# Patient Record
Sex: Female | Born: 1970 | Hispanic: No | Marital: Single | Smoking: Never smoker
Health system: Southern US, Community
[De-identification: ages and names within clinical notes are randomized; demographics above are authoritative.]

## PROBLEM LIST (undated history)

## (undated) DIAGNOSIS — J309 Allergic rhinitis, unspecified: Secondary | ICD-10-CM

## (undated) DIAGNOSIS — L309 Dermatitis, unspecified: Secondary | ICD-10-CM

## (undated) DIAGNOSIS — J45909 Unspecified asthma, uncomplicated: Secondary | ICD-10-CM

## (undated) HISTORY — DX: Allergic rhinitis, unspecified: J30.9

## (undated) HISTORY — DX: Unspecified asthma, uncomplicated: J45.909

## (undated) HISTORY — DX: Dermatitis, unspecified: L30.9

---

## 2001-06-24 ENCOUNTER — Other Ambulatory Visit: Admission: RE | Admit: 2001-06-24 | Discharge: 2001-06-24 | Payer: Self-pay | Admitting: Obstetrics and Gynecology

## 2002-07-01 ENCOUNTER — Other Ambulatory Visit: Admission: RE | Admit: 2002-07-01 | Discharge: 2002-07-01 | Payer: Self-pay | Admitting: Obstetrics and Gynecology

## 2003-07-28 ENCOUNTER — Other Ambulatory Visit: Admission: RE | Admit: 2003-07-28 | Discharge: 2003-07-28 | Payer: Self-pay | Admitting: Obstetrics and Gynecology

## 2003-08-07 ENCOUNTER — Encounter: Admission: RE | Admit: 2003-08-07 | Discharge: 2003-08-07 | Payer: Self-pay | Admitting: Internal Medicine

## 2004-08-16 ENCOUNTER — Other Ambulatory Visit: Admission: RE | Admit: 2004-08-16 | Discharge: 2004-08-16 | Payer: Self-pay | Admitting: Obstetrics and Gynecology

## 2005-09-03 ENCOUNTER — Other Ambulatory Visit: Admission: RE | Admit: 2005-09-03 | Discharge: 2005-09-03 | Payer: Self-pay | Admitting: Obstetrics and Gynecology

## 2006-09-04 ENCOUNTER — Other Ambulatory Visit: Admission: RE | Admit: 2006-09-04 | Discharge: 2006-09-04 | Payer: Self-pay | Admitting: Obstetrics and Gynecology

## 2006-09-29 HISTORY — PX: REFRACTIVE SURGERY: SHX103

## 2007-10-08 ENCOUNTER — Other Ambulatory Visit: Admission: RE | Admit: 2007-10-08 | Discharge: 2007-10-08 | Payer: Self-pay | Admitting: Obstetrics and Gynecology

## 2008-11-17 ENCOUNTER — Other Ambulatory Visit: Admission: RE | Admit: 2008-11-17 | Discharge: 2008-11-17 | Payer: Self-pay | Admitting: Obstetrics and Gynecology

## 2009-02-14 ENCOUNTER — Encounter: Admission: RE | Admit: 2009-02-14 | Discharge: 2009-02-14 | Payer: Self-pay | Admitting: Obstetrics and Gynecology

## 2010-02-15 ENCOUNTER — Encounter: Admission: RE | Admit: 2010-02-15 | Discharge: 2010-02-15 | Payer: Self-pay | Admitting: Obstetrics and Gynecology

## 2011-01-17 ENCOUNTER — Other Ambulatory Visit: Payer: Self-pay | Admitting: Obstetrics and Gynecology

## 2011-01-17 DIAGNOSIS — Z1231 Encounter for screening mammogram for malignant neoplasm of breast: Secondary | ICD-10-CM

## 2011-02-19 ENCOUNTER — Ambulatory Visit
Admission: RE | Admit: 2011-02-19 | Discharge: 2011-02-19 | Disposition: A | Discharge status: Home / Self Care | Payer: 59 | Source: Ambulatory Visit | Attending: Obstetrics and Gynecology | Admitting: Obstetrics and Gynecology

## 2011-02-19 DIAGNOSIS — Z1231 Encounter for screening mammogram for malignant neoplasm of breast: Secondary | ICD-10-CM

## 2012-02-16 ENCOUNTER — Other Ambulatory Visit: Payer: Self-pay | Admitting: Obstetrics and Gynecology

## 2012-02-16 DIAGNOSIS — Z1231 Encounter for screening mammogram for malignant neoplasm of breast: Secondary | ICD-10-CM

## 2012-02-26 ENCOUNTER — Ambulatory Visit
Admission: RE | Admit: 2012-02-26 | Discharge: 2012-02-26 | Disposition: A | Discharge status: Home / Self Care | Payer: 59 | Source: Ambulatory Visit | Attending: Obstetrics and Gynecology | Admitting: Obstetrics and Gynecology

## 2012-02-26 DIAGNOSIS — Z1231 Encounter for screening mammogram for malignant neoplasm of breast: Secondary | ICD-10-CM

## 2012-12-22 ENCOUNTER — Telehealth: Payer: Self-pay | Admitting: *Deleted

## 2012-12-22 MED ORDER — LEVONORGESTREL-ETHINYL ESTRAD 0.1-20 MG-MCG PO TABS: 1.0000 | ORAL_TABLET | Freq: Every day | ORAL | Status: DC

## 2012-12-22 NOTE — Telephone Encounter (Signed)
Was sent via fax on 12/21/12

## 2013-02-02 ENCOUNTER — Other Ambulatory Visit: Payer: Self-pay

## 2013-02-02 DIAGNOSIS — Z1231 Encounter for screening mammogram for malignant neoplasm of breast: Secondary | ICD-10-CM

## 2013-02-04 ENCOUNTER — Ambulatory Visit: Payer: Self-pay | Admitting: Obstetrics and Gynecology

## 2013-02-16 ENCOUNTER — Encounter: Payer: Self-pay | Admitting: Obstetrics and Gynecology

## 2013-02-17 ENCOUNTER — Encounter: Payer: Self-pay | Admitting: Obstetrics and Gynecology

## 2013-02-18 ENCOUNTER — Ambulatory Visit (INDEPENDENT_AMBULATORY_CARE_PROVIDER_SITE_OTHER): Payer: 59 | Admitting: Obstetrics and Gynecology

## 2013-02-18 ENCOUNTER — Encounter: Payer: Self-pay | Admitting: Obstetrics and Gynecology

## 2013-02-18 VITALS — BP 102/70 | Ht 61.25 in | Wt 147.0 lb

## 2013-02-18 DIAGNOSIS — Z01419 Encounter for gynecological examination (general) (routine) without abnormal findings: Secondary | ICD-10-CM

## 2013-02-18 DIAGNOSIS — Z0000 Encounter for general adult medical examination without abnormal findings: Secondary | ICD-10-CM

## 2013-02-18 MED ORDER — LEVONORGESTREL-ETHINYL ESTRAD 0.1-20 MG-MCG PO TABS: 1.0000 | ORAL_TABLET | Freq: Every day | ORAL | Status: DC

## 2013-02-18 NOTE — Patient Instructions (Addendum)

## 2013-02-18 NOTE — Progress Notes (Signed)
42 y.o.   Single    African American   female   G0P0000   here for annual exam. Wants to quit work or at least cut back.  Menses regular on OC's and wants to continue.    Patient's last menstrual period was 01/21/2013.          Sexually active: no  The current method of family planning is OCP (estrogen/progesterone).    Exercising: jogging, swimming, cycling, weight training Last mammogram:  02/26/12 neg Last pap smear: 12/21/09 neg History of abnormal pap: no Smoking:never Alcohol: 1 glass of wine a week Last colonoscopy:never Last Bone Density:  never Last tetanus shot: less than 10 years Last cholesterol check: 12/23/12  normal  Hgb:@ cone                Urine: @ cone   Family History  Problem Relation Age of Onset  . Diabetes Mother   . Hypertension Father   . Hyperlipidemia Father   . Sarcoidosis Sister     There are no active problems to display for this patient.   Past Medical History  Diagnosis Date  . Asthma   . Eczema     Past Surgical History  Procedure Laterality Date  . Refractive surgery  2008    Allergies: Lanolin and Neomycin  Current Outpatient Prescriptions  Medication Sig Dispense Refill  . ALBUTEROL IN Inhale into the lungs as needed.      Marland Kitchen azelastine (ASTELIN) 137 MCG/SPRAY nasal spray Place 1 spray into the nose 2 (two) times daily. Use in each nostril as directed      . Bepotastine Besilate (BEPREVE) 1.5 % SOLN as needed.      . cetirizine (ZYRTEC) 10 MG tablet Take 10 mg by mouth daily.      . Cholecalciferol (VITAMIN D PO) Take 600 mg by mouth daily.       . hydrOXYzine (ATARAX/VISTARIL) 10 MG tablet Take 10 mg by mouth 3 (three) times daily as needed for itching.      Marland Kitchen levonorgestrel-ethinyl estradiol (FALMINA) 0.1-20 MG-MCG tablet Take 1 tablet by mouth daily.  1 Package  2  . mometasone (ELOCON) 0.1 % cream Apply topically as needed.      . Multiple Vitamin (MULTIVITAMIN) tablet Take 1 tablet by mouth daily.       No current  facility-administered medications for this visit.    ROS: Pertinent items are noted in HPI.  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[place], no children, MD allergist  Exam:    BP 102/70  Ht 5' 1.25" (1.556 m)  Wt 147 lb (66.679 kg)  BMI 27.54 kg/m2  LMP 01/21/2013   Wt Readings from Last 3 Encounters:  02/18/13 147 lb (66.679 kg)     Ht Readings from Last 3 Encounters:  02/18/13 5' 1.25" (1.556 m)    General appearance: alert, cooperative and appears stated age Head: Normocephalic, without obvious abnormality, atraumatic Neck: no adenopathy, supple, symmetrical, trachea midline and thyroid not enlarged, symmetric, no tenderness/mass/nodules Lungs: clear to auscultation bilaterally Breasts: Inspection negative, No nipple retraction or dimpling, No nipple discharge or bleeding, No axillary or supraclavicular adenopathy, Normal to palpation without dominant masses Heart: regular rate and rhythm Abdomen: soft, non-tender; bowel sounds normal; no masses,  no organomegaly Extremities: extremities normal, atraumatic, no cyanosis or edema Skin: Skin color, texture, turgor normal. No rashes or lesions Lymph nodes: Cervical, supraclavicular, and axillary nodes normal. No abnormal inguinal nodes palpated Neurologic: Grossly normal  Pelvic: External genitalia:  no lesions              Urethra:  normal appearing urethra with no masses, tenderness or lesions              Bartholins and Skenes: normal                 Vagina: normal appearing vagina with normal color and discharge, no lesions              Cervix: normal appearance              Pap taken: yes        Bimanual Exam:  Uterus:  uterus is normal size, shape, consistency and nontender, AF, mobile                                      Adnexa: normal adnexa in size, nontender and no masses                                      Rectovaginal: Confirms                                      Anus:  normal sphincter tone, no  lesions  A: normal gyn exam, OC's     P: mammogram pap smear counseled on breast self exam, mammography screening, adequate intake of calcium and vitamin D, diet and exercise return annually or prn     An After Visit Summary was printed and given to the patient.

## 2013-02-19 LAB — VITAMIN D 25 HYDROXY (VIT D DEFICIENCY, FRACTURES): Vit D, 25-Hydroxy: 34 ng/mL (ref 30–89)

## 2013-02-23 LAB — IPS PAP TEST WITH HPV

## 2013-03-07 ENCOUNTER — Ambulatory Visit
Admission: RE | Admit: 2013-03-07 | Discharge: 2013-03-07 | Disposition: A | Discharge status: Home / Self Care | Payer: 59 | Source: Ambulatory Visit

## 2013-03-07 DIAGNOSIS — Z1231 Encounter for screening mammogram for malignant neoplasm of breast: Secondary | ICD-10-CM

## 2013-03-16 ENCOUNTER — Telehealth: Payer: Self-pay | Admitting: Obstetrics and Gynecology

## 2013-03-16 NOTE — Telephone Encounter (Signed)
Patient just wanted to let you know that her mammogram was normal. She just wanted to touch base with you and find out if she to be seen of can she can have her year f/u

## 2013-03-17 NOTE — Telephone Encounter (Signed)
LMTCB  aa 

## 2013-03-17 NOTE — Telephone Encounter (Signed)
Spoke with pt about MMG. Pt states the results showed something different than usual, "fibroglandular tissue" and pt is wondering if she can discuss whether or not to wait a whole year for her next MMG or exam, or if she needs to do something sooner. After learning of CR's retirement in August, pt wants to see her again to get her opinion. Scheduled consult visit 05-13-13 at 8:30 per pt request.

## 2013-05-09 ENCOUNTER — Ambulatory Visit: Payer: Self-pay | Admitting: Obstetrics and Gynecology

## 2013-05-13 ENCOUNTER — Ambulatory Visit (INDEPENDENT_AMBULATORY_CARE_PROVIDER_SITE_OTHER): Payer: 59 | Admitting: Obstetrics and Gynecology

## 2013-05-13 ENCOUNTER — Encounter: Payer: Self-pay | Admitting: Obstetrics and Gynecology

## 2013-05-13 VITALS — BP 120/70 | Wt 148.0 lb

## 2013-05-13 DIAGNOSIS — N644 Mastodynia: Secondary | ICD-10-CM

## 2013-05-13 NOTE — Patient Instructions (Signed)
Return for routine care

## 2013-05-13 NOTE — Progress Notes (Signed)
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  Pt c/o left lateral mastodynia for about a year.  She has never felt a mass, and I felt no mass at her AnEx in May 2014.  She was concerned about the language in the letter she received about her mammo results saying she has dense breasts.  She has gained 20 pounds recently, and does zumba class among other exercises.  We discussed breast density and reviewed her mammo report together.  We discussed how suspensory ligaments can be strained especially if her breasts have increased fat from her weight gain.  Rec:  Excellent support of her breasts with sports bras, maybe even at night and especially at zumba class.  Of course, weight loss would be helpful, too.    Questions answered.

## 2013-08-11 ENCOUNTER — Telehealth: Payer: Self-pay | Admitting: Obstetrics and Gynecology

## 2013-08-11 NOTE — Telephone Encounter (Signed)
AEX was 02/18/13 #3 packs with 3 refills was sent to Pharmacy, called pharmacy patient picked up 3 months this early November patient lost those rx's according to Pharmacist. She didn't have any refills left because the pharmacy had a old rx that was sent in 3/14.  Called in refills until 5/14. Patient is aware that she will have to pay out of pocket since the insurance had already covered her for the next 3 months, patient said she already discussed that with the pharmacy.

## 2013-08-11 NOTE — Telephone Encounter (Signed)
Patient calling needing refills for generic Lutera  in 90 day increments please. Patient thought we had sent this at last AEX but pharmacy does not have it on file.  [facility]

## 2014-03-06 ENCOUNTER — Ambulatory Visit: Payer: 59 | Admitting: Obstetrics & Gynecology

## 2014-03-20 ENCOUNTER — Encounter: Payer: Self-pay | Admitting: Obstetrics & Gynecology

## 2014-03-20 ENCOUNTER — Ambulatory Visit (INDEPENDENT_AMBULATORY_CARE_PROVIDER_SITE_OTHER): Payer: 59 | Admitting: Obstetrics & Gynecology

## 2014-03-20 ENCOUNTER — Other Ambulatory Visit: Payer: Self-pay

## 2014-03-20 VITALS — BP 128/83 | HR 67 | Temp 98.0°F | Ht 62.0 in | Wt 155.0 lb

## 2014-03-20 DIAGNOSIS — Z1231 Encounter for screening mammogram for malignant neoplasm of breast: Secondary | ICD-10-CM

## 2014-03-20 DIAGNOSIS — Z01419 Encounter for gynecological examination (general) (routine) without abnormal findings: Secondary | ICD-10-CM

## 2014-03-20 MED ORDER — LEVONORGESTREL-ETHINYL ESTRAD 0.1-20 MG-MCG PO TABS: 1.0000 | ORAL_TABLET | Freq: Every day | ORAL | Status: DC

## 2014-03-20 MED ORDER — CIPROFLOXACIN HCL 250 MG PO TABS
250.0000 mg | ORAL_TABLET | Freq: Two times a day (BID) | ORAL | Status: DC
Start: 2014-03-20 — End: 2016-05-07

## 2014-03-20 NOTE — Progress Notes (Signed)
Subjective:     Frances Stewart is a 43 y.o. female here for a routine exam.  Current complaints: none.    Personal health questionnaire:  Is patient [name], have a family history of breast and/or ovarian cancer: no Is there a family history of uterine cancer diagnosed at age < 21, gastrointestinal cancer, urinary tract cancer, family member who is a Field seismologist syndrome-associated carrier: no Is the patient overweight and hypertensive, family history of diabetes, personal history of gestational diabetes or PCOS: no Is patient over 51, have PCOS,  family history of premature CHD under age 37, diabetes, smoke, have hypertension or peripheral artery disease:  no   Gynecologic History Patient's last menstrual period was 03/12/2014. Contraception: OCP (estrogen/progesterone) Lutera Last Pap: normal. Results were: normal Last mammogram: 1 yr ago. Results were: normal  Obstetric History OB History  Gravida Para Term Preterm AB SAB TAB Ectopic Multiple Living  0 0 0 0 0 0 0 0 0 0         Past Medical History  Diagnosis Date  . Asthma   . Eczema     Past Surgical History  Procedure Laterality Date  . Refractive surgery  2008    Current outpatient prescriptions:ALBUTEROL IN, Inhale into the lungs as needed., Disp: , Rfl: ;  azelastine (ASTELIN) 137 MCG/SPRAY nasal spray, Place 1 spray into the nose 2 (two) times daily. Use in each nostril as directed, Disp: , Rfl: ;  Bepotastine Besilate (BEPREVE) 1.5 % SOLN, as needed., Disp: , Rfl: ;  cetirizine (ZYRTEC) 10 MG tablet, Take 10 mg by mouth daily., Disp: , Rfl:  Cholecalciferol (VITAMIN D PO), Take 600 mg by mouth daily. , Disp: , Rfl: ;  hydrOXYzine (ATARAX/VISTARIL) 10 MG tablet, Take 10 mg by mouth 3 (three) times daily as needed for itching., Disp: , Rfl: ;  levonorgestrel-ethinyl estradiol (AVIANE,ALESSE,LESSINA) 0.1-20 MG-MCG tablet, Take 1 tablet by mouth daily., Disp: , Rfl: ;  mometasone (ELOCON) 0.1 % cream, Apply topically as  needed., Disp: , Rfl:  Multiple Vitamin (MULTIVITAMIN) tablet, Take 1 tablet by mouth daily., Disp: , Rfl:  Allergies  Allergen Reactions  . Lanolin Rash  . Neomycin Rash    History  Substance Use Topics  . Smoking status: Never Smoker   . Smokeless tobacco: Never Used  . Alcohol Use: 0.5 oz/week    1 drink(s) per week     Comment: 1 glass of wine a week    Family History  Problem Relation Age of Onset  . Diabetes Mother   . Hypertension Father   . Hyperlipidemia Father   . Heart disease Father   . Sarcoidosis Sister       Review of Systems  Constitutional: negative for fatigue and weight loss Respiratory: negative for cough and wheezing Cardiovascular: negative for chest pain, fatigue and palpitations Gastrointestinal: negative for abdominal pain and change in bowel habits Musculoskeletal:negative for myalgias Neurological: negative for gait problems and tremors Behavioral/Psych: negative for abusive relationship, depression Endocrine: negative for temperature intolerance   Genitourinary:negative for abnormal menstrual periods, genital lesions, hot flashes, sexual problems and vaginal discharge Integument/breast: negative for breast lump, breast tenderness, nipple discharge and skin lesion(s)    Objective:       BP 128/83  Pulse 67  Temp(Src) 98 F (36.7 C)  Ht 5\' 2"  (1.575 m)  Wt 70.308 kg (155 lb)  BMI 28.34 kg/m2  LMP 03/12/2014 General:   alert  Skin:   no rash or abnormalities  Lungs:  clear to auscultation bilaterally  Heart:   regular rate and rhythm, S1, S2 normal, no murmur, click, rub or gallop  Breasts:   normal without suspicious masses, skin or nipple changes or axillary nodes  Abdomen:  normal findings: no organomegaly, soft, non-tender and no hernia  Pelvis:  External genitalia: normal general appearance Urinary system: urethral meatus normal and bladder without fullness, nontender Vaginal: normal without tenderness, induration or  masses Cervix: normal appearance Adnexa: normal bimanual exam Uterus: anteverted and non-tender, normal size   Lab Review  Labs reviewed no Radiologic studies reviewed no    Assessment:    Healthy female exam.    Plan:    Education reviewed: calcium supplements, low fat, low cholesterol diet and weight bearing exercise. Contraception: OCP (estrogen/progesterone).   Meds ordered this encounter  Medications  . levonorgestrel-ethinyl estradiol (AVIANE,ALESSE,LESSINA) 0.1-20 MG-MCG tablet    Sig: Take 1 tablet by mouth daily.    Follow up as needed.

## 2014-03-20 NOTE — Patient Instructions (Signed)

## 2014-03-21 LAB — PAP IG (IMAGE GUIDED)

## 2014-03-24 ENCOUNTER — Ambulatory Visit: Payer: 59 | Admitting: Obstetrics and Gynecology

## 2014-03-27 ENCOUNTER — Ambulatory Visit
Admission: RE | Admit: 2014-03-27 | Discharge: 2014-03-27 | Disposition: A | Discharge status: Home / Self Care | Payer: 59 | Source: Ambulatory Visit

## 2014-03-27 DIAGNOSIS — Z1231 Encounter for screening mammogram for malignant neoplasm of breast: Secondary | ICD-10-CM

## 2014-09-25 ENCOUNTER — Encounter: Payer: Self-pay | Admitting: *Deleted

## 2014-09-26 ENCOUNTER — Encounter: Payer: Self-pay | Admitting: Obstetrics & Gynecology

## 2014-11-27 ENCOUNTER — Other Ambulatory Visit: Payer: Self-pay | Admitting: *Deleted

## 2014-11-27 DIAGNOSIS — Z3041 Encounter for surveillance of contraceptive pills: Secondary | ICD-10-CM

## 2014-11-27 MED ORDER — LEVONORGESTREL-ETHINYL ESTRAD 0.1-20 MG-MCG PO TABS: 1.0000 | ORAL_TABLET | Freq: Every day | ORAL | Status: DC

## 2015-03-26 ENCOUNTER — Ambulatory Visit: Payer: 59 | Admitting: Obstetrics & Gynecology

## 2015-05-14 ENCOUNTER — Other Ambulatory Visit: Payer: Self-pay

## 2015-05-14 DIAGNOSIS — Z1231 Encounter for screening mammogram for malignant neoplasm of breast: Secondary | ICD-10-CM

## 2015-05-18 ENCOUNTER — Ambulatory Visit (INDEPENDENT_AMBULATORY_CARE_PROVIDER_SITE_OTHER): Payer: 59 | Admitting: Certified Nurse Midwife

## 2015-05-18 ENCOUNTER — Encounter: Payer: Self-pay | Admitting: Certified Nurse Midwife

## 2015-05-18 VITALS — BP 131/86 | HR 73 | Temp 97.6°F | Ht 62.0 in | Wt 158.2 lb

## 2015-05-18 DIAGNOSIS — Z01419 Encounter for gynecological examination (general) (routine) without abnormal findings: Secondary | ICD-10-CM

## 2015-05-18 DIAGNOSIS — Z3041 Encounter for surveillance of contraceptive pills: Secondary | ICD-10-CM | POA: Diagnosis not present

## 2015-05-18 MED ORDER — LEVONORGESTREL-ETHINYL ESTRAD 0.1-20 MG-MCG PO TABS: 1.0000 | ORAL_TABLET | Freq: Every day | ORAL | Status: DC

## 2015-05-18 NOTE — Progress Notes (Signed)
Patient ID: [facility], female   DOB: 1971/05/19, 44 y.o.   MRN: [id number]    Subjective:        Frances [facility] is a 44 y.o. female here for a routine exam.  Current complaints: none.  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  Declines STD screening examination.  Denies any premenopausal symptoms.    Does desire to loose about 25 lbs.    Personal health questionnaire:  Is patient Frances Stewart, have a family history of breast and/or ovarian cancer: no Is there a family history of uterine cancer diagnosed at age < 31, gastrointestinal cancer, urinary tract cancer, family member who is a Field seismologist syndrome-associated carrier: no Is the patient overweight and hypertensive, family history of diabetes, personal history of gestational diabetes, preeclampsia or PCOS: no Is patient over 19, have PCOS,  family history of premature CHD under age 77, diabetes, smoke, have hypertension or peripheral artery disease:  yes At any time, has a partner hit, kicked or otherwise hurt or frightened you?: no Over the past 2 weeks, have you felt down, depressed or hopeless?: no Over the past 2 weeks, have you felt little interest or pleasure in doing things?:no   Gynecologic History Patient's last menstrual period was 05/06/2015. Contraception: OCP (estrogen/progesterone) Last Pap: 03/20/14. Results were: normal Last mammogram: 03/28/14. Results were: normal  Obstetric History OB History  Gravida Para Term Preterm AB SAB TAB Ectopic Multiple Living  0 0 0 0 0 0 0 0 0 0         Past Medical History  Diagnosis Date  . Asthma   . Eczema     Past Surgical History  Procedure Laterality Date  . Refractive surgery  2008     Current outpatient prescriptions:  .  ALBUTEROL IN, Inhale into the lungs as needed., Disp: , Rfl:  .  azelastine (ASTELIN) 137 MCG/SPRAY nasal spray, Place 1 spray into the nose 2 (two) times daily. Use in each nostril as directed, Disp: , Rfl:  .  Bepotastine Besilate (BEPREVE) 1.5  % SOLN, as needed., Disp: , Rfl:  .  cetirizine (ZYRTEC) 10 MG tablet, Take 10 mg by mouth daily., Disp: , Rfl:  .  Cholecalciferol (VITAMIN D PO), Take 600 mg by mouth daily. , Disp: , Rfl:  .  hydrOXYzine (ATARAX/VISTARIL) 10 MG tablet, Take 10 mg by mouth 3 (three) times daily as needed for itching., Disp: , Rfl:  .  levonorgestrel-ethinyl estradiol (AVIANE,ALESSE,LESSINA) 0.1-20 MG-MCG tablet, Take 1 tablet by mouth daily. Patient is using continuous pills for 3 months and then to have cycle., Disp: 3 Package, Rfl: 4 .  magnesium oxide (MAG-OX) 400 MG tablet, Take 400 mg by mouth daily., Disp: , Rfl:  .  mometasone (ELOCON) 0.1 % cream, Apply topically as needed., Disp: , Rfl:  .  Multiple Vitamin (MULTIVITAMIN) tablet, Take 1 tablet by mouth daily., Disp: , Rfl:  .  ciprofloxacin (CIPRO) 250 MG tablet, Take 1 tablet (250 mg total) by mouth 2 (two) times daily. For 5 days (Patient not taking: Reported on 05/18/2015), Disp: 10 tablet, Rfl: 0 Allergies  Allergen Reactions  . Lanolin Rash  . Neomycin Rash    Social History  Substance Use Topics  . Smoking status: Never Smoker   . Smokeless tobacco: Never Used  . Alcohol Use: 0.5 oz/week    1 drink(s) per week     Comment: 1 glass of wine a week    Family History  Problem Relation Age of  Onset  . Diabetes Mother   . Hypertension Father   . Hyperlipidemia Father   . Heart disease Father   . Sarcoidosis Sister       Review of Systems  Constitutional: negative for fatigue and weight loss Respiratory: negative for cough and wheezing Cardiovascular: negative for chest pain, fatigue and palpitations Gastrointestinal: negative for abdominal pain and change in bowel habits Musculoskeletal:negative for myalgias Neurological: negative for gait problems and tremors Behavioral/Psych: negative for abusive relationship, depression Endocrine: negative for temperature intolerance   Genitourinary:negative for abnormal menstrual periods,  genital lesions, hot flashes, sexual problems and vaginal discharge Integument/breast: negative for breast lump, breast tenderness, nipple discharge and skin lesion(s)    Objective:       BP 131/86 mmHg  Pulse 73  Temp(Src) 97.6 F (36.4 C)  Ht 5\' 2"  (1.575 m)  Wt 158 lb 3.2 oz (71.759 kg)  BMI 28.93 kg/m2  LMP 05/06/2015 General:   alert  Skin:   no rash or abnormalities  Lungs:   clear to auscultation bilaterally  Heart:   regular rate and rhythm, S1, S2 normal, no murmur, click, rub or gallop  Breasts:   normal without suspicious masses, skin or nipple changes or axillary nodes  Abdomen:  normal findings: no organomegaly, soft, non-tender and no hernia  Pelvis:  External genitalia: normal general appearance Urinary system: urethral meatus normal and bladder without fullness, nontender Vaginal: normal without tenderness, induration or masses Cervix: normal appearance Adnexa: normal bimanual exam Uterus: anteverted and non-tender, normal size   Lab Review Urine pregnancy test Labs reviewed yes Radiologic studies reviewed yes  50% of 30 min visit spent on counseling and coordination of care.   Assessment:    Healthy female exam.   Contraception Management.  Plan:    Education reviewed: calcium supplements, depression evaluation, low fat, low cholesterol diet, safe sex/STD prevention, self breast exams, skin cancer screening and weight bearing exercise. Contraception: OCP (estrogen/progesterone). Follow up in: 1 year. Mammogram scheduled in September.    Meds ordered this encounter  Medications  . magnesium oxide (MAG-OX) 400 MG tablet    Sig: Take 400 mg by mouth daily.  Marland Kitchen levonorgestrel-ethinyl estradiol (AVIANE,ALESSE,LESSINA) 0.1-20 MG-MCG tablet    Sig: Take 1 tablet by mouth daily. Patient is using continuous pills for 3 months and then to have cycle.    Dispense:  3 Package    Refill:  4   Orders Placed This Encounter  Procedures  . SureSwab,  Vaginosis/Vaginitis Plus

## 2015-05-23 LAB — SURESWAB, VAGINOSIS/VAGINITIS PLUS
Atopobium vaginae: NOT DETECTED Log (cells/mL)
C. albicans, DNA: NOT DETECTED
C. glabrata, DNA: NOT DETECTED
C. parapsilosis, DNA: NOT DETECTED
C. trachomatis RNA, TMA: NOT DETECTED
C. tropicalis, DNA: NOT DETECTED
Gardnerella vaginalis: NOT DETECTED Log (cells/mL)
LACTOBACILLUS SPECIES: NOT DETECTED Log (cells/mL)
MEGASPHAERA SPECIES: NOT DETECTED Log (cells/mL)
N. gonorrhoeae RNA, TMA: NOT DETECTED
T. vaginalis RNA, QL TMA: NOT DETECTED

## 2015-05-23 LAB — PAP, TP IMAGING W/ HPV RNA, RFLX HPV TYPE 16,18/45: HPV mRNA, High Risk: NOT DETECTED

## 2015-06-18 ENCOUNTER — Ambulatory Visit
Admission: RE | Admit: 2015-06-18 | Discharge: 2015-06-18 | Disposition: A | Discharge status: Home / Self Care | Payer: 59 | Source: Ambulatory Visit

## 2015-06-18 ENCOUNTER — Ambulatory Visit: Payer: 59

## 2015-06-18 DIAGNOSIS — Z1231 Encounter for screening mammogram for malignant neoplasm of breast: Secondary | ICD-10-CM

## 2015-10-22 DIAGNOSIS — R51 Headache: Secondary | ICD-10-CM | POA: Diagnosis not present

## 2015-10-22 DIAGNOSIS — M542 Cervicalgia: Secondary | ICD-10-CM | POA: Diagnosis not present

## 2015-11-26 MED FILL — LEVONOR-ETH ESTRAD 0.1-0.02: 0.1-20 | 84 days supply | Qty: 112 | Fill #3

## 2015-12-10 DIAGNOSIS — L821 Other seborrheic keratosis: Secondary | ICD-10-CM | POA: Diagnosis not present

## 2015-12-10 DIAGNOSIS — D171 Benign lipomatous neoplasm of skin and subcutaneous tissue of trunk: Secondary | ICD-10-CM | POA: Diagnosis not present

## 2015-12-10 DIAGNOSIS — L300 Nummular dermatitis: Secondary | ICD-10-CM | POA: Diagnosis not present

## 2015-12-10 MED FILL — hydrOXYzine HCL 10 MG TABS: 10 | 30 days supply | Qty: 60 | Fill #0

## 2015-12-10 MED FILL — HYDROCORTISONE 2.5% CREAM: 2.5 | 30 days supply | Qty: 30 | Fill #0

## 2015-12-10 MED FILL — TRIAMCINOLONE 0.1% CREAM: 0.1 | 30 days supply | Qty: 80 | Fill #0

## 2015-12-21 MED FILL — MAGNESIUM OXIDE 400 MG TAB: 400 | 90 days supply | Qty: 90 | Fill #0

## 2016-01-15 MED FILL — CROMOLYN 4% EYE DROPS: 4 | 50 days supply | Qty: 10 | Fill #1

## 2016-01-15 MED FILL — HYDROCORTISONE 2.5% CREAM: 2.5 | 30 days supply | Qty: 30 | Fill #1

## 2016-01-15 MED FILL — TRIAMCINOLONE 0.1% CREAM: 0.1 | 30 days supply | Qty: 80 | Fill #1

## 2016-02-06 DIAGNOSIS — Z23 Encounter for immunization: Secondary | ICD-10-CM | POA: Diagnosis not present

## 2016-02-06 DIAGNOSIS — Z0000 Encounter for general adult medical examination without abnormal findings: Secondary | ICD-10-CM | POA: Diagnosis not present

## 2016-02-14 DIAGNOSIS — M722 Plantar fascial fibromatosis: Secondary | ICD-10-CM | POA: Diagnosis not present

## 2016-02-14 DIAGNOSIS — M7042 Prepatellar bursitis, left knee: Secondary | ICD-10-CM | POA: Diagnosis not present

## 2016-02-14 DIAGNOSIS — M9905 Segmental and somatic dysfunction of pelvic region: Secondary | ICD-10-CM | POA: Diagnosis not present

## 2016-02-14 DIAGNOSIS — M791 Myalgia: Secondary | ICD-10-CM | POA: Diagnosis not present

## 2016-02-14 DIAGNOSIS — M7661 Achilles tendinitis, right leg: Secondary | ICD-10-CM | POA: Diagnosis not present

## 2016-02-14 DIAGNOSIS — M9906 Segmental and somatic dysfunction of lower extremity: Secondary | ICD-10-CM | POA: Diagnosis not present

## 2016-02-14 DIAGNOSIS — M9903 Segmental and somatic dysfunction of lumbar region: Secondary | ICD-10-CM | POA: Diagnosis not present

## 2016-02-14 DIAGNOSIS — M7071 Other bursitis of hip, right hip: Secondary | ICD-10-CM | POA: Diagnosis not present

## 2016-02-29 MED FILL — LEVONOR-ETH ESTRAD 0.1-0.02: 0.1-20 | 84 days supply | Qty: 112 | Fill #0

## 2016-02-29 MED FILL — AZELASTINE HCL 137 MCG SPRY: 0.1 | 30 days supply | Qty: 30 | Fill #0

## 2016-03-13 MED FILL — MAGNESIUM OXIDE 400 MG TAB: 400 | 90 days supply | Qty: 90 | Fill #1

## 2016-04-21 ENCOUNTER — Other Ambulatory Visit: Payer: Self-pay | Admitting: *Deleted

## 2016-04-21 ENCOUNTER — Telehealth: Payer: Self-pay | Admitting: *Deleted

## 2016-04-21 DIAGNOSIS — Z3041 Encounter for surveillance of contraceptive pills: Secondary | ICD-10-CM

## 2016-04-21 MED ORDER — LEVONORGESTREL-ETHINYL ESTRAD 0.1-20 MG-MCG PO TABS: 1.0000 | ORAL_TABLET | Freq: Every day | ORAL | 4 refills | Status: DC

## 2016-04-21 NOTE — Telephone Encounter (Signed)
Patient is going to have a change in her insurance before her annual exam. She is asking for a refill of her OCP so she can still get a 3 month supply. Patient moved her appointment up and she is checking her insurance to make sure she can be seen early.

## 2016-04-22 ENCOUNTER — Other Ambulatory Visit: Payer: Self-pay | Admitting: Certified Nurse Midwife

## 2016-04-22 DIAGNOSIS — Z3041 Encounter for surveillance of contraceptive pills: Secondary | ICD-10-CM

## 2016-04-22 MED ORDER — LEVONORGESTREL-ETHINYL ESTRAD 0.1-20 MG-MCG PO TABS: 1.0000 | ORAL_TABLET | Freq: Every day | ORAL | 4 refills | Status: DC

## 2016-04-22 NOTE — Telephone Encounter (Signed)
Please let her know I have sent in the refills for her.  Thank you.  R.Anyssa Sharpless CNM

## 2016-04-28 ENCOUNTER — Other Ambulatory Visit: Payer: Self-pay | Admitting: Certified Nurse Midwife

## 2016-04-28 ENCOUNTER — Other Ambulatory Visit: Payer: Self-pay | Admitting: Obstetrics & Gynecology

## 2016-04-28 DIAGNOSIS — Z1231 Encounter for screening mammogram for malignant neoplasm of breast: Secondary | ICD-10-CM

## 2016-05-01 ENCOUNTER — Ambulatory Visit: Payer: 59 | Admitting: Certified Nurse Midwife

## 2016-05-07 ENCOUNTER — Ambulatory Visit (INDEPENDENT_AMBULATORY_CARE_PROVIDER_SITE_OTHER): Payer: 59 | Admitting: Obstetrics & Gynecology

## 2016-05-07 ENCOUNTER — Encounter: Payer: Self-pay | Admitting: Obstetrics & Gynecology

## 2016-05-07 VITALS — BP 141/94 | HR 67 | Temp 98.2°F | Ht 62.0 in | Wt 172.3 lb

## 2016-05-07 DIAGNOSIS — N852 Hypertrophy of uterus: Secondary | ICD-10-CM

## 2016-05-07 DIAGNOSIS — Z01419 Encounter for gynecological examination (general) (routine) without abnormal findings: Secondary | ICD-10-CM | POA: Diagnosis not present

## 2016-05-07 NOTE — Patient Instructions (Signed)
Uterine Fibroids Uterine fibroids are tissue masses (tumors) that can develop in the womb (uterus). They are also called leiomyomas. This type of tumor is not cancerous (benign) and does not spread to other parts of the body outside of the pelvic area, which is between the hip bones. Occasionally, fibroids may develop in the fallopian tubes, in the cervix, or on the support structures (ligaments) that surround the uterus. You can have one or many fibroids. Fibroids can vary in size, weight, and where they grow in the uterus. Some can become quite large. Most fibroids do not require medical treatment. CAUSES A fibroid can develop when a single uterine cell keeps growing (replicating). Most cells in the human body have a control mechanism that keeps them from replicating without control. SIGNS AND SYMPTOMS Symptoms may include:   Heavy bleeding during your period.  Bleeding or spotting between periods.  Pelvic pain and pressure.  Bladder problems, such as needing to urinate more often (urinary frequency) or urgently.  Inability to reproduce offspring (infertility).  Miscarriages. DIAGNOSIS Uterine fibroids are diagnosed through a physical exam. Your health care provider may feel the lumpy tumors during a pelvic exam. Ultrasonography and an MRI may be done to determine the size, location, and number of fibroids. TREATMENT Treatment may include:  Watchful waiting. This involves getting the fibroid checked by your health care provider to see if it grows or shrinks. Follow your health care provider's recommendations for how often to have this checked.  Hormone medicines. These can be taken by mouth or given through an intrauterine device (IUD).  Surgery.  Removing the fibroids (myomectomy) or the uterus (hysterectomy).  Removing blood supply to the fibroids (uterine artery embolization). If fibroids interfere with your fertility and you want to become pregnant, your health care provider  may recommend having the fibroids removed.  HOME CARE INSTRUCTIONS  Keep all follow-up visits as directed by your health care provider. This is important.  Take medicines only as directed by your health care provider.  If you were prescribed a hormone treatment, take the hormone medicines exactly as directed.  Do not take aspirin, because it can cause bleeding.  Ask your health care provider about taking iron pills and increasing the amount of dark green, leafy vegetables in your diet. These actions can help to boost your blood iron levels, which may be affected by heavy menstrual bleeding.  Pay close attention to your period and tell your health care provider about any changes, such as:  Increased blood flow that requires you to use more pads or tampons than usual per month.  A change in the number of days that your period lasts per month.  A change in symptoms that are associated with your period, such as abdominal cramping or back pain. SEEK MEDICAL CARE IF:  You have pelvic pain, back pain, or abdominal cramps that cannot be controlled with medicines.  You have an increase in bleeding between and during periods.  You soak tampons or pads in a half hour or less.  You feel lightheaded, extra tired, or weak. SEEK IMMEDIATE MEDICAL CARE IF:  You faint.  You have a sudden increase in pelvic pain.   This information is not intended to replace advice given to you by your health care provider. Make sure you discuss any questions you have with your health care provider.   Document Released: 09/12/2000 Document Revised: 10/06/2014 Document Reviewed: 03/14/2014 Elsevier Interactive Patient Education 2016 [employer]  

## 2016-05-07 NOTE — Progress Notes (Signed)
Subjective:     [name] is a 45 y.o. female here for a routine exam.  Current complaints: none. Pt with occ HA takes Excedrin prn and Magnesium for that.  She denies aura with her HA.    Gynecologic History Patient's last menstrual period was 04/25/2016 (exact date). Contraception: OCP (estrogen/progesterone) Last Pap: 04/2015. Results were: normal Last mammogram: 05/2015. Results were: normal  Obstetric History OB History  Gravida Para Term Preterm AB Living  0 0 0 0 0 0  SAB TAB Ectopic Multiple Live Births  0 0 0 0          The following portions of the patient's history were reviewed and updated as appropriate: allergies, current medications, past family history, past medical history, past social history, past surgical history and problem list.   Review of Systems Pertinent items are noted in HPI.    Objective:   BP (!) 141/94   Pulse 67   Temp 98.2 F (36.8 C) (Oral)   Ht 5\' 2"  (1.575 m)   Wt 172 lb 4.8 oz (78.2 kg)   LMP 04/25/2016 (Exact Date)   BMI 31.51 kg/m  General Appearance:    Alert, cooperative, no distress, appears stated age  Head:    Normocephalic, without obvious abnormality, atraumatic  Eyes:    conjunctiva/corneas clear, EOM's intact, both eyes  Ears:    Normal external ear canals, both ears  Nose:   Nares normal, septum midline, mucosa normal, no drainage    or sinus tenderness  Throat:   Lips, mucosa, and tongue normal; teeth and gums normal  Neck:   Supple, symmetrical, trachea midline, no adenopathy;    thyroid:  no enlargement/tenderness/nodules  Back:     Symmetric, no curvature, ROM normal, no CVA tenderness  Lungs:     Clear to auscultation bilaterally, respirations unlabored  Chest Wall:    No tenderness or deformity   Heart:    Regular rate and rhythm, S1 and S2 normal, no murmur, rub   or gallop  Breast Exam:    No tenderness, masses, or nipple abnormality  Abdomen:     Soft, non-tender, bowel sounds active all four quadrants,   no masses, no organomegaly  Genitalia:    Normal female without lesion, discharge or tenderness; enlarged uterus ~10-12 weeks sized with irreg contour.       Extremities:   Extremities normal, atraumatic, no cyanosis or edema  Pulses:   2+ and symmetric all extremities  Skin:   Skin color, texture, turgor normal, no rashes or lesions     Assessment:    Healthy female exam.   Enlarged uterus- suspect uterine fibroids Contraception counseling- reviewed LARC. Pt wants to continue OCP's for now.   Elevated BP      Plan:  F/u in 1 year or sooner prn Cont OCPs Pelvic sono to eval uterine enlargement Mammogram in sept- pt will make appt. Pt will walk in for nurse visit to recheck BP  Chardonay Scritchfield L. Harraway-Smith, M.D., Cherlynn June

## 2016-05-08 DIAGNOSIS — Z124 Encounter for screening for malignant neoplasm of cervix: Secondary | ICD-10-CM | POA: Diagnosis not present

## 2016-05-12 LAB — IGP, APTIMA HPV, RFX 16/18,45
HPV Aptima: NEGATIVE
PAP Smear Comment: 0

## 2016-05-15 ENCOUNTER — Ambulatory Visit (HOSPITAL_COMMUNITY): Payer: 59

## 2016-05-15 MED FILL — MAGNESIUM OXIDE 400 MG TAB: 400 | 90 days supply | Qty: 90 | Fill #2

## 2016-05-15 MED FILL — LEVONOR-ETH ESTRAD 0.1-0.02: 0.1-20 | 84 days supply | Qty: 112 | Fill #1

## 2016-05-15 MED FILL — TRIAMCINOLONE 0.1% CREAM: 0.1 | 30 days supply | Qty: 80 | Fill #2

## 2016-05-20 ENCOUNTER — Ambulatory Visit: Payer: 59 | Admitting: Certified Nurse Midwife

## 2016-05-21 ENCOUNTER — Ambulatory Visit: Payer: 59 | Admitting: Certified Nurse Midwife

## 2016-06-19 ENCOUNTER — Ambulatory Visit: Payer: 59

## 2016-06-20 ENCOUNTER — Ambulatory Visit: Payer: 59

## 2016-06-24 ENCOUNTER — Ambulatory Visit
Admission: RE | Admit: 2016-06-24 | Discharge: 2016-06-24 | Disposition: A | Discharge status: Home / Self Care | Payer: 59 | Source: Ambulatory Visit | Attending: Certified Nurse Midwife | Admitting: Certified Nurse Midwife

## 2016-06-24 DIAGNOSIS — Z1231 Encounter for screening mammogram for malignant neoplasm of breast: Secondary | ICD-10-CM | POA: Diagnosis not present

## 2016-07-29 ENCOUNTER — Ambulatory Visit (INDEPENDENT_AMBULATORY_CARE_PROVIDER_SITE_OTHER): Payer: 59 | Admitting: Podiatry

## 2016-07-29 ENCOUNTER — Encounter: Payer: Self-pay | Admitting: Podiatry

## 2016-07-29 ENCOUNTER — Ambulatory Visit (HOSPITAL_BASED_OUTPATIENT_CLINIC_OR_DEPARTMENT_OTHER)
Admission: RE | Admit: 2016-07-29 | Discharge: 2016-07-29 | Disposition: A | Discharge status: Home / Self Care | Payer: 59 | Source: Ambulatory Visit | Attending: Podiatry | Admitting: Podiatry

## 2016-07-29 DIAGNOSIS — R52 Pain, unspecified: Secondary | ICD-10-CM | POA: Diagnosis not present

## 2016-07-29 DIAGNOSIS — M722 Plantar fascial fibromatosis: Secondary | ICD-10-CM

## 2016-07-29 DIAGNOSIS — M79673 Pain in unspecified foot: Secondary | ICD-10-CM | POA: Diagnosis not present

## 2016-07-29 DIAGNOSIS — M79671 Pain in right foot: Secondary | ICD-10-CM | POA: Diagnosis not present

## 2016-07-29 NOTE — Patient Instructions (Signed)

## 2016-07-29 NOTE — Progress Notes (Signed)
   Subjective:    Patient ID: Frances [facility], female    DOB: 1971/07/24, 45 y.o.   MRN: [id number]  HPI    Review of Systems     Objective:   Physical Exam        Assessment & Plan:

## 2016-07-29 NOTE — Progress Notes (Signed)
   Subjective:    Patient ID: Frances Stewart, female    DOB: 01/29/1971, 45 y.o.   MRN: [id number]  HPI  45 year old female presents the office today for concerns of right foot pain within the arch of the foot. This for about 7 months. She gets pain to the arch and the heel. She does have pain when she first gets up. She was able to run a 5K over the weekend without any pain and she uses intermittent discomfort. No recent injury or trauma. Denies any swelling or redness. No numbness or tingling. Pain does not wake her up at night. No other complaints at this time.  Review of Systems  All other systems reviewed and are negative.      Objective:   Physical Exam General: AAO x3, NAD  Dermatological: Skin is warm, dry and supple bilateral. Nails x 10 are well manicured; remaining integument appears unremarkable at this time. There are no open sores, no preulcerative lesions, no rash or signs of infection present.  Vascular: Dorsalis Pedis artery and Posterior Tibial artery pedal pulses are 2/4 bilateral with immedate capillary fill time.  There is no pain with calf compression, swelling, warmth, erythema.   Neruologic: Grossly intact via light touch bilateral. Vibratory intact via tuning fork bilateral. Protective threshold with Semmes Wienstein monofilament intact to all pedal sites bilateral.   Musculoskeletal: There is mild decrease in medial arch height upon weightbearing. Subjective there is tenderness on medial band plantar fasciitis the foot however she denies any pain today. There is no area pinpoint bony tenderness there is no pain the vibratory sensation. Range of motion intact. Muscular strength 5/5 in all groups tested bilateral. Equinus is present.  Gait: Unassisted, Nonantalgic.      Assessment & Plan:  45 year old female with right heel pain/arch pain likely plantar fasciitis -Treatment options discussed including all alternatives, risks, and complications -Etiology of  symptoms were discussed -X-rays ordered and reviewed -Night splint -Discussed orthotics. She was scanned for orthotics and they were sent to [facility] labs. -Follow-up in 3-4 or sooner if needed. Call any questions or concerns in the meantime.  Celesta Gentile, DPM

## 2016-08-04 ENCOUNTER — Telehealth: Payer: Self-pay | Admitting: *Deleted

## 2016-08-04 NOTE — Telephone Encounter (Signed)
Pt asked if she could wear the ProWedge more than just resting, more like to bed.  I told pt yes, but not to walk in it is was not made for that, could break or be slick. Pt states understanding.

## 2016-08-19 ENCOUNTER — Encounter: Payer: Self-pay | Admitting: Podiatry

## 2016-08-19 ENCOUNTER — Ambulatory Visit (INDEPENDENT_AMBULATORY_CARE_PROVIDER_SITE_OTHER): Payer: 59 | Admitting: Podiatry

## 2016-08-19 DIAGNOSIS — M722 Plantar fascial fibromatosis: Secondary | ICD-10-CM

## 2016-08-19 NOTE — Progress Notes (Signed)
Patient presents to [facility]. She states she was doing good and declined an appointment with myself. She was seen by Cranford Mon, CMA. Oral and Written break-in instructions discussed. Follow-up in 4 weeks.

## 2016-08-19 NOTE — Patient Instructions (Signed)

## 2016-08-29 DIAGNOSIS — K5900 Constipation, unspecified: Secondary | ICD-10-CM | POA: Diagnosis not present

## 2016-09-02 MED FILL — LEVONOR-ETH ESTRAD 0.1-0.02: 0.1-20 | 84 days supply | Qty: 112 | Fill #0

## 2016-09-02 MED FILL — TRIAMCINOLONE 0.1% CREAM: 0.1 | 30 days supply | Qty: 80 | Fill #3

## 2016-09-02 MED FILL — HYDROCORTISONE 2.5% CREAM: 2.5 | 30 days supply | Qty: 30 | Fill #2

## 2016-09-04 DIAGNOSIS — D509 Iron deficiency anemia, unspecified: Secondary | ICD-10-CM | POA: Diagnosis not present

## 2016-09-04 DIAGNOSIS — R194 Change in bowel habit: Secondary | ICD-10-CM | POA: Diagnosis not present

## 2016-09-04 DIAGNOSIS — Z1211 Encounter for screening for malignant neoplasm of colon: Secondary | ICD-10-CM | POA: Diagnosis not present

## 2016-09-04 DIAGNOSIS — K5904 Chronic idiopathic constipation: Secondary | ICD-10-CM | POA: Diagnosis not present

## 2016-09-04 MED FILL — GAVILYTE-G SOLUTION: 236 | 30 days supply | Qty: 4000 | Fill #0

## 2016-09-04 MED FILL — MAGNESIUM OXIDE 400 MG TAB: 400 | 90 days supply | Qty: 90 | Fill #0

## 2016-09-15 DIAGNOSIS — D125 Benign neoplasm of sigmoid colon: Secondary | ICD-10-CM | POA: Diagnosis not present

## 2016-09-15 DIAGNOSIS — K635 Polyp of colon: Secondary | ICD-10-CM | POA: Diagnosis not present

## 2016-09-15 DIAGNOSIS — Z1211 Encounter for screening for malignant neoplasm of colon: Secondary | ICD-10-CM | POA: Diagnosis not present

## 2016-09-16 MED FILL — CROMOLYN 4% EYE DROPS: 4 | 50 days supply | Qty: 10 | Fill #0

## 2016-09-24 MED FILL — SM EYE ITCH RELIEF 0.025% D: 0.025 | 20 days supply | Qty: 15 | Fill #0

## 2016-11-11 MED FILL — SM EYE ITCH RELIEF 0.025% D: 0.025 | 20 days supply | Qty: 15 | Fill #1

## 2016-11-11 MED FILL — MAGNESIUM OXIDE 400 MG TAB: 400 | 90 days supply | Qty: 90 | Fill #1

## 2016-11-12 MED FILL — TRIAMCINOLONE 0.1% CREAM: 0.1 | 30 days supply | Qty: 80 | Fill #0

## 2016-12-18 MED FILL — LEVONOR-ETH ESTRAD 0.1-0.02: 0.1-20 | 21 days supply | Qty: 28 | Fill #1

## 2017-01-20 MED FILL — LEVONOR-ETH ESTRAD 0.1-0.02: 0.1-20 | 84 days supply | Qty: 112 | Fill #2

## 2017-01-28 MED FILL — PROAIR RESPICLICK INHAL PWD: 108 (90 BAS | 17 days supply | Qty: 1 | Fill #0

## 2017-01-30 MED FILL — HYDROCORTISONE 2.5% CREAM: 2.5 | 10 days supply | Qty: 30 | Fill #0

## 2017-05-04 MED FILL — SM EYE ITCH RELIEF 0.025% D: 0.025 | 20 days supply | Qty: 15 | Fill #2

## 2017-05-04 MED FILL — HYDROCORTISONE 2.5% CREAM: 2.5 | 10 days supply | Qty: 30 | Fill #1

## 2017-05-05 DIAGNOSIS — J309 Allergic rhinitis, unspecified: Secondary | ICD-10-CM | POA: Diagnosis not present

## 2017-05-05 DIAGNOSIS — R030 Elevated blood-pressure reading, without diagnosis of hypertension: Secondary | ICD-10-CM | POA: Diagnosis not present

## 2017-05-05 DIAGNOSIS — G5711 Meralgia paresthetica, right lower limb: Secondary | ICD-10-CM | POA: Diagnosis not present

## 2017-05-05 DIAGNOSIS — L309 Dermatitis, unspecified: Secondary | ICD-10-CM | POA: Diagnosis not present

## 2017-05-05 MED FILL — MAGNESIUM OXIDE 400 MG TAB: 400 | 90 days supply | Qty: 90 | Fill #0

## 2017-05-05 MED FILL — TRIAMCINOLONE 0.1% CREAM: 0.1 | 30 days supply | Qty: 80 | Fill #0

## 2017-05-06 DIAGNOSIS — H538 Other visual disturbances: Secondary | ICD-10-CM | POA: Diagnosis not present

## 2017-05-06 DIAGNOSIS — H11133 Conjunctival pigmentations, bilateral: Secondary | ICD-10-CM | POA: Diagnosis not present

## 2017-05-06 DIAGNOSIS — H1013 Acute atopic conjunctivitis, bilateral: Secondary | ICD-10-CM | POA: Diagnosis not present

## 2017-05-15 ENCOUNTER — Other Ambulatory Visit: Payer: Self-pay | Admitting: Obstetrics & Gynecology

## 2017-05-15 DIAGNOSIS — Z1231 Encounter for screening mammogram for malignant neoplasm of breast: Secondary | ICD-10-CM

## 2017-05-19 ENCOUNTER — Ambulatory Visit: Payer: 59 | Admitting: Obstetrics & Gynecology

## 2017-05-21 MED FILL — TRIAMCINOLONE 0.1% CREAM: 0.1 | 20 days supply | Qty: 80 | Fill #1

## 2017-05-21 MED FILL — CROMOLYN 4% EYE DROPS: 4 | 50 days supply | Qty: 10 | Fill #0

## 2017-05-27 ENCOUNTER — Encounter: Payer: Self-pay | Admitting: Obstetrics and Gynecology

## 2017-05-27 ENCOUNTER — Ambulatory Visit (INDEPENDENT_AMBULATORY_CARE_PROVIDER_SITE_OTHER): Payer: 59 | Admitting: Obstetrics and Gynecology

## 2017-05-27 VITALS — BP 130/88 | HR 75 | Ht 62.0 in | Wt 167.2 lb

## 2017-05-27 DIAGNOSIS — Z1151 Encounter for screening for human papillomavirus (HPV): Secondary | ICD-10-CM | POA: Diagnosis not present

## 2017-05-27 DIAGNOSIS — Z124 Encounter for screening for malignant neoplasm of cervix: Secondary | ICD-10-CM

## 2017-05-27 DIAGNOSIS — Z01419 Encounter for gynecological examination (general) (routine) without abnormal findings: Secondary | ICD-10-CM | POA: Diagnosis not present

## 2017-05-27 DIAGNOSIS — Z3041 Encounter for surveillance of contraceptive pills: Secondary | ICD-10-CM

## 2017-05-27 MED ORDER — LEVONORGESTREL-ETHINYL ESTRAD 0.1-20 MG-MCG PO TABS: 1.0000 | ORAL_TABLET | Freq: Every day | ORAL | 4 refills | Status: DC

## 2017-05-27 MED FILL — LEVONOR-ETH ESTRAD 0.1-0.02: 0.1-20 | 84 days supply | Qty: 112 | Fill #0

## 2017-05-27 NOTE — Progress Notes (Signed)
Subjective:     Frances Stewart is a 46 y.o. female G60 with BMI 30 who is here for a comprehensive physical exam. The patient reports no problems. She is sexually active using OCP for contraception. She denies any abnormal vaginal bleeding, discharge or pelvic pain. She denies any leakage of fluid. She denies any vasomotor symptoms  Past Medical History:  Diagnosis Date  . Allergic rhinitis   . Asthma   . Eczema    Past Surgical History:  Procedure Laterality Date  . REFRACTIVE SURGERY  2008   Family History  Problem Relation Age of Onset  . Hypertension Mother   . Hypertension Father   . Hyperlipidemia Father   . Heart disease Father   . Sarcoidosis Sister   . Hypertension Sister      Social History   Social History  . Marital status: Single    Spouse name: N/A  . Number of children: N/A  . Years of education: N/A   Occupational History  . Not on file.   Social History Main Topics  . Smoking status: Never Smoker  . Smokeless tobacco: Never Used  . Alcohol use 0.5 oz/week    1 Standard drinks or equivalent per week     Comment: 1 glass of wine a week  . Drug use: No  . Sexual activity: Not Currently    Partners: Male    Birth control/ protection: Pill     Comment: Lutera   Other Topics Concern  . Not on file   Social History Narrative  . No narrative on file   Health Maintenance  Topic Date Due  . HIV Screening  01/14/1984  . INFLUENZA VACCINE  04/29/2017  . TETANUS/TDAP  09/29/2017  . PAP SMEAR  05/09/2019       Review of Systems Pertinent items are noted in HPI.   Objective:  Blood pressure 130/88, pulse 75, height 5\' 2"  (1.575 m), weight 167 lb 3.2 oz (75.8 kg), last menstrual period 05/06/2017.     GENERAL: Well-developed, well-nourished female in no acute distress.  HEENT: Normocephalic, atraumatic. Sclerae anicteric.  NECK: Supple. Normal thyroid.  LUNGS: Clear to auscultation bilaterally.  HEART: Regular rate and rhythm. BREASTS:  Symmetric in size. No palpable masses or lymphadenopathy, skin changes, or nipple drainage. ABDOMEN: Soft, nontender, nondistended. No organomegaly. PELVIC: Normal external female genitalia. Vagina is pink and rugated.  Normal discharge. Normal appearing cervix. Uterus is12-weeks in size. No adnexal mass or tenderness. EXTREMITIES: No cyanosis, clubbing, or edema, 2+ distal pulses.    Assessment:    Healthy female exam.      Plan:    Pap smear collected Patient scheduled for mammogram in September Patient reports normal routine health labs in March with PCP Patient did not have pelvic ultrasound due to cost. Patient is currently asymptomatic from her fibroid uterus Patient will be contacted with abnormal results She declined STD screen See After Visit Summary for Counseling Recommendations

## 2017-05-27 NOTE — Addendum Note (Signed)
Addended by: Maryruth Eve on: 05/27/2017 03:43 PM   Modules accepted: Orders

## 2017-05-28 ENCOUNTER — Other Ambulatory Visit: Payer: Self-pay | Admitting: Obstetrics and Gynecology

## 2017-05-28 LAB — VITAMIN D 25 HYDROXY (VIT D DEFICIENCY, FRACTURES): Vit D, 25-Hydroxy: 19.6 ng/mL — ABNORMAL LOW (ref 30.0–100.0)

## 2017-05-28 LAB — HEMOGLOBIN A1C
Est. average glucose Bld gHb Est-mCnc: 105 mg/dL
Hgb A1c MFr Bld: 5.3 % (ref 4.8–5.6)

## 2017-05-28 MED ORDER — VITAMIN D (ERGOCALCIFEROL) 1.25 MG (50000 UNIT) PO CAPS: 50000.0000 [IU] | ORAL_CAPSULE | ORAL | 1 refills | Status: DC

## 2017-05-29 ENCOUNTER — Telehealth: Payer: Self-pay

## 2017-05-29 LAB — CYTOLOGY - PAP: Diagnosis: NEGATIVE

## 2017-05-29 NOTE — Telephone Encounter (Signed)
Contacted pt and advised of results and rx sent by provider. 

## 2017-07-27 MED FILL — CROMOLYN 4% EYE DROPS: 4 | 50 days supply | Qty: 10 | Fill #1

## 2017-07-27 MED FILL — PROAIR RESPICLICK INHAL PWD: 108 (90 BAS | 17 days supply | Qty: 1 | Fill #1

## 2017-07-27 MED FILL — MAGNESIUM OXIDE 400 MG TABS: 400 | 90 days supply | Qty: 90 | Fill #1

## 2017-07-27 MED FILL — HYDROCORTISONE 2.5% CREAM: 2.5 | 30 days supply | Qty: 60 | Fill #0

## 2017-07-27 MED FILL — TRIAMCINOLONE 0.1% CREAM: 0.1 | 20 days supply | Qty: 80 | Fill #2

## 2017-07-29 MED FILL — VIT D2 1.25 MG (50,000 UNIT: 1.25 MG | 84 days supply | Qty: 12 | Fill #0

## 2017-07-31 ENCOUNTER — Ambulatory Visit
Admission: RE | Admit: 2017-07-31 | Discharge: 2017-07-31 | Disposition: A | Discharge status: Home / Self Care | Payer: 59 | Source: Ambulatory Visit | Attending: Obstetrics & Gynecology | Admitting: Obstetrics & Gynecology

## 2017-07-31 DIAGNOSIS — Z1231 Encounter for screening mammogram for malignant neoplasm of breast: Secondary | ICD-10-CM | POA: Diagnosis not present

## 2017-07-31 MED FILL — LEVONOR-ETH ESTRAD 0.1-0.02: 0.1-20 | 84 days supply | Qty: 112 | Fill #1

## 2017-07-31 MED FILL — SM EYE ITCH RELIEF 0.025% D: 0.025 | 90 days supply | Qty: 15 | Fill #0

## 2017-09-18 MED FILL — AZELASTINE HCL 137 MCG SPRY: 0.1 | 75 days supply | Qty: 90 | Fill #0

## 2017-09-18 MED FILL — HYDROCORTISONE 2.5% CREAM: 2.5 | 30 days supply | Qty: 60 | Fill #1

## 2017-09-21 MED FILL — TRIAMCINOLONE 0.1% CREAM: 0.1 | 20 days supply | Qty: 80 | Fill #0

## 2017-12-09 MED FILL — MAGNESIUM OXIDE 400 MG TABS: 400 | 90 days supply | Qty: 90 | Fill #2 | Status: TO

## 2017-12-09 MED FILL — HYDROCORTISONE 2.5% CREAM: 2.5 | 30 days supply | Qty: 60 | Fill #2

## 2017-12-09 MED FILL — CROMOLYN 4% EYE DROPS: 4 | 50 days supply | Qty: 10 | Fill #2 | Status: TO

## 2017-12-09 MED FILL — LEVONOR-ETH ESTRAD 0.1-0.02: 0.1-20 | 84 days supply | Qty: 112 | Fill #2 | Status: TO

## 2017-12-11 MED FILL — AZELASTINE HCL 137 MCG SPRY: 0.1 | 75 days supply | Qty: 90 | Fill #1 | Status: TO

## 2017-12-25 ENCOUNTER — Telehealth: Payer: Self-pay | Admitting: *Deleted

## 2017-12-25 NOTE — Telephone Encounter (Signed)
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

## 2017-12-28 NOTE — Telephone Encounter (Signed)
Left message requesting clarification of pt's request, orthotic reorder or script.

## 2017-12-28 NOTE — Telephone Encounter (Signed)
OK to do a prescription or have her come in to see [facility] for inserts.

## 2018-01-04 ENCOUNTER — Telehealth: Payer: Self-pay | Admitting: *Deleted

## 2018-01-04 NOTE — Telephone Encounter (Signed)
Dr. Orinda Kenner asked the cost of the orthotics and to be sent to her.

## 2018-01-05 NOTE — Telephone Encounter (Signed)
Called pt and she is asking for a rx for orthotics and she will take care of it in [place].. Pt is also considering getting her old ones refurbished. If I get the script I can mail it to pt.

## 2018-01-06 ENCOUNTER — Other Ambulatory Visit: Payer: Self-pay | Admitting: Podiatry

## 2018-01-06 DIAGNOSIS — M722 Plantar fascial fibromatosis: Secondary | ICD-10-CM

## 2018-09-23 ENCOUNTER — Ambulatory Visit (INDEPENDENT_AMBULATORY_CARE_PROVIDER_SITE_OTHER): Payer: Self-pay | Admitting: Obstetrics and Gynecology

## 2018-09-23 ENCOUNTER — Encounter: Payer: Self-pay | Admitting: Obstetrics and Gynecology

## 2018-09-23 VITALS — BP 142/99 | HR 77 | Resp 16 | Ht 62.0 in | Wt 171.6 lb

## 2018-09-23 DIAGNOSIS — Z01419 Encounter for gynecological examination (general) (routine) without abnormal findings: Secondary | ICD-10-CM

## 2018-09-23 DIAGNOSIS — Z3009 Encounter for other general counseling and advice on contraception: Secondary | ICD-10-CM

## 2018-09-23 DIAGNOSIS — Z3041 Encounter for surveillance of contraceptive pills: Secondary | ICD-10-CM

## 2018-09-23 MED ORDER — LEVONORGESTREL-ETHINYL ESTRAD 0.1-20 MG-MCG PO TABS: 1.0000 | ORAL_TABLET | Freq: Every day | ORAL | 4 refills | Status: DC

## 2018-09-23 MED ORDER — BLOOD PRESSURE MONITOR/L CUFF MISC: 1.0000 | Freq: Every day | 0 refills | Status: AC

## 2018-09-23 MED FILL — LEVONOR-ETH ESTRAD 0.1-0.02: 0.1-20 | 84 days supply | Qty: 112 | Fill #0

## 2018-09-23 NOTE — Patient Instructions (Signed)

## 2018-09-23 NOTE — Progress Notes (Signed)
GYNECOLOGY ANNUAL PREVENTATIVE CARE ENCOUNTER NOTE  Subjective:   Frances Stewart is a 47 y.o. G0P0000 female here for a annual gynecologic exam. Current complaints: no issues.  Denies abnormal vaginal bleeding, discharge, pelvic pain, problems with intercourse or other gynecologic concerns. Due for pap 04/2020.  She has regular bleeding with her OCPs, she is happy with OCPs. Takes them continuously 3 months at a time.  Gynecologic History Patient's last menstrual period was 08/31/2018 (approximate). Contraception: OCP (estrogen/progesterone) Last Pap: 04/2017. Results were: normal Last mammogram: 2018. Results were: Birads 1  Obstetric History OB History  Gravida Para Term Preterm AB Living  0 0 0 0 0 0  SAB TAB Ectopic Multiple Live Births  0 0 0 0     Past Medical History:  Diagnosis Date  . Allergic rhinitis   . Asthma   . Eczema    Past Surgical History:  Procedure Laterality Date  . REFRACTIVE SURGERY  2008   Current Outpatient Medications on File Prior to Visit  Medication Sig Dispense Refill  . ALBUTEROL IN Inhale into the lungs as needed.    Marland Kitchen azelastine (ASTELIN) 137 MCG/SPRAY nasal spray Place 1 spray into the nose 2 (two) times daily. Use in each nostril as directed    . cetirizine (ZYRTEC) 10 MG tablet Take 10 mg by mouth daily.    . cholecalciferol (VITAMIN D) 1000 units tablet Take 1,000 Units by mouth 2 (two) times daily.    . cromolyn (OPTICROM) 4 % ophthalmic solution INSTILL 1 DROP IN BOTH EYES TWICE A DAY  3  . hydrocortisone 2.5 % cream APPLY ON THE FACE AS DIRECTED  3  . hydrOXYzine (ATARAX/VISTARIL) 10 MG tablet Take 10 mg by mouth 3 (three) times daily as needed for itching.    . magnesium oxide (MAG-OX) 400 MG tablet Take 400 mg by mouth daily.    . Multiple Vitamin (MULTIVITAMIN) tablet Take 1 tablet by mouth daily.    . SM EYE ITCH RELIEF 0.025 % ophthalmic solution PLACE 1 DROP IN EACH EYE TWICE A DAY  3  . triamcinolone cream (KENALOG) 0.1 %  APPLY A THIN LAYER TO THE AFFECTED AREA TWICE DAILY  2   No current facility-administered medications on file prior to visit.    Allergies  Allergen Reactions  . Lanolin Rash  . Neomycin Rash   Social History   Socioeconomic History  . Marital status: Single    Spouse name: Not on file  . Number of children: Not on file  . Years of education: Not on file  . Highest education level: Not on file  Occupational History  . Not on file  Social Needs  . Financial resource strain: Not on file  . Food insecurity:    Worry: Not on file    Inability: Not on file  . Transportation needs:    Medical: Not on file    Non-medical: Not on file  Tobacco Use  . Smoking status: Never Smoker  . Smokeless tobacco: Never Used  Substance and Sexual Activity  . Alcohol use: Yes    Alcohol/week: 1.0 standard drinks    Types: 1 Standard drinks or equivalent per week    Comment: 1 glass of wine a week  . Drug use: No  . Sexual activity: Not Currently    Partners: Male    Birth control/protection: Pill    Comment: Lutera  Lifestyle  . Physical activity:    Days per week: Not on file  Minutes per session: Not on file  . Stress: Not on file  Relationships  . Social connections:    Talks on phone: Not on file    Gets together: Not on file    Attends religious service: Not on file    Active member of club or organization: Not on file    Attends meetings of clubs or organizations: Not on file    Relationship status: Not on file  . Intimate partner violence:    Fear of current or ex partner: Not on file    Emotionally abused: Not on file    Physically abused: Not on file    Forced sexual activity: Not on file  Other Topics Concern  . Not on file  Social History Narrative  . Not on file   Family History  Problem Relation Age of Onset  . Hypertension Mother   . Hypertension Father   . Hyperlipidemia Father   . Heart disease Father   . Sarcoidosis Sister   . Hypertension Sister   .  Breast cancer Neg Hx    Diet:  Exercise: working with a trainer  The following portions of the patient's history were reviewed and updated as appropriate: allergies, current medications, past family history, past medical history, past social history, past surgical history and problem list.  Review of Systems Pertinent items are noted in HPI.   Objective:  BP (!) 142/99 (BP Location: Right Arm, Patient Position: Sitting, Cuff Size: Normal)   Pulse 77   Resp 16   Ht 5\' 2"  (1.575 m)   Wt 171 lb 9.6 oz (77.8 kg)   LMP 08/31/2018 (Approximate)   BMI 31.39 kg/m  CONSTITUTIONAL: Well-developed, well-nourished female in no acute distress.  HENT:  Normocephalic, atraumatic, External right and left ear normal. Oropharynx is clear and moist EYES: Conjunctivae and EOM are normal. Pupils are equal, round, and reactive to light. No scleral icterus.  NECK: Normal range of motion, supple, no masses.  Normal thyroid.  SKIN: Skin is warm and dry. No rash noted. Not diaphoretic. No erythema. No pallor. NEUROLOGIC: Alert and oriented to person, place, and time. Normal reflexes, muscle tone coordination. No cranial nerve deficit noted. PSYCHIATRIC: Normal mood and affect. Normal behavior. Normal judgment and thought content. CARDIOVASCULAR: Normal heart rate noted, regular rhythm RESPIRATORY: Clear to auscultation bilaterally. Effort and breath sounds normal, no problems with respiration noted. BREASTS: Symmetric in size. No masses, skin changes, nipple drainage, or lymphadenopathy. ABDOMEN: Soft, normal bowel sounds, no distention noted.  No tenderness, rebound or guarding.  PELVIC: Normal appearing external genitalia; normal appearing vaginal mucosa and cervix.  No abnormal discharge noted. Normal uterine size, fibroid palpated on right, no other palpable masses, no uterine or adnexal tenderness. MUSCULOSKELETAL: Normal range of motion. No tenderness.  No cyanosis, clubbing, or edema.  2+ distal  pulses.  PAP 04/2017 Adequacy Reason Satisfactory for evaluation, endocervical/transformation zone component PRESENT. Diagnosis NEGATIVE FOR INTRAEPITHELIAL LESIONS OR MALIGNANCY. TANYA SPEED Cytotechnologist Electronic Signature (Case signed 05/29/2017)  Assessment and Plan:   1. Well woman exam Declined STI screen - MM 3D SCREEN BREAST BILATERAL; Future - benign exam  2. Encounter for other general counseling or advice on contraception - On OCPs, has been very happy with them, has regulated bleeding. Would like to continue as long as it is safe to do so. Reviewed risks of OCPs, particularly elevated risk of stroke and clot and that if she has HTN, would take her off OCPs as combined risk considered too  high.  - With elevated BP today, will send home with BP cuff. If BP remains elevated at home, would consider chronic HTN and recommend she discontinue OCPs.  - Reviewed LARCS, she will consider IUD.  - Reviewed this plan with patient and she is agreeable, will take BP for next few weeks daily and send results through [place]. - cuff and refill for OCPs sent to pharmacy   Encouraged improvement in diet and exercise.  Mammogram ordered  Routine preventative health maintenance measures emphasized. Please refer to After Visit Summary for other counseling recommendations.    Feliz Beam, M.D. Attending Center for [name] [facility] farm manager)

## 2018-09-28 ENCOUNTER — Ambulatory Visit: Payer: 59 | Admitting: Obstetrics and Gynecology

## 2018-12-24 ENCOUNTER — Ambulatory Visit: Payer: Self-pay

## 2018-12-27 ENCOUNTER — Other Ambulatory Visit: Payer: Self-pay | Admitting: Internal Medicine

## 2018-12-27 MED FILL — LARISSIA 0.1-20 MG-MCG TABS: 0.1-20 | 84 days supply | Qty: 112 | Fill #0

## 2018-12-28 MED FILL — MAGNESIUM OXIDE 400 MG TAB: 400 (240 MG | 120 days supply | Qty: 120 | Fill #0

## 2019-04-08 ENCOUNTER — Other Ambulatory Visit: Payer: Self-pay

## 2019-04-08 ENCOUNTER — Ambulatory Visit
Admission: RE | Admit: 2019-04-08 | Discharge: 2019-04-08 | Disposition: A | Discharge status: Home / Self Care | Payer: Self-pay | Source: Ambulatory Visit | Attending: Obstetrics and Gynecology | Admitting: Obstetrics and Gynecology

## 2019-04-08 DIAGNOSIS — Z01419 Encounter for gynecological examination (general) (routine) without abnormal findings: Secondary | ICD-10-CM

## 2019-06-27 MED FILL — LARISSIA 0.1-20 MG-MCG TABS: 0.1-20 | 84 days supply | Qty: 112 | Fill #0

## 2019-09-28 ENCOUNTER — Ambulatory Visit: Payer: Self-pay | Admitting: Obstetrics and Gynecology

## 2019-09-28 ENCOUNTER — Encounter: Payer: Self-pay | Admitting: Obstetrics and Gynecology

## 2019-09-28 ENCOUNTER — Other Ambulatory Visit: Payer: Self-pay

## 2019-09-28 ENCOUNTER — Ambulatory Visit (INDEPENDENT_AMBULATORY_CARE_PROVIDER_SITE_OTHER): Payer: Self-pay | Admitting: Obstetrics and Gynecology

## 2019-09-28 VITALS — BP 142/93 | HR 79

## 2019-09-28 DIAGNOSIS — Z01419 Encounter for gynecological examination (general) (routine) without abnormal findings: Secondary | ICD-10-CM

## 2019-09-28 MED ORDER — SLYND 4 MG PO TABS: 1.0000 | ORAL_TABLET | Freq: Every day | ORAL | 4 refills | Status: DC

## 2019-09-28 NOTE — Progress Notes (Signed)
Subjective:     Frances Stewart is a 48 y.o. female P0 with LMP 09/28/19 who is here for a comprehensive physical exam. The patient reports no problems. Patient is not sexually active. She denies any pelvic pain, abnormal bleeding or discharge. Patient desires to continue with OCP reporting BP 117/60-70's at home  Past Medical History:  Diagnosis Date  . Allergic rhinitis   . Asthma   . Eczema    Past Surgical History:  Procedure Laterality Date  . REFRACTIVE SURGERY  2008   Family History  Problem Relation Age of Onset  . Hypertension Mother   . Hypertension Father   . Hyperlipidemia Father   . Heart disease Father   . Sarcoidosis Sister   . Hypertension Sister   . Breast cancer Neg Hx      Social History   Socioeconomic History  . Marital status: Single    Spouse name: Not on file  . Number of children: Not on file  . Years of education: Not on file  . Highest education level: Not on file  Occupational History  . Not on file  Tobacco Use  . Smoking status: Never Smoker  . Smokeless tobacco: Never Used  Substance and Sexual Activity  . Alcohol use: Yes    Alcohol/week: 1.0 standard drinks    Types: 1 Standard drinks or equivalent per week    Comment: 1 glass of wine a week  . Drug use: No  . Sexual activity: Not Currently    Partners: Male    Birth control/protection: Pill    Comment: Lutera  Other Topics Concern  . Not on file  Social History Narrative  . Not on file   Social Determinants of Health   Financial Resource Strain:   . Difficulty of Paying Living Expenses: Not on file  Food Insecurity:   . Worried About Charity fundraiser in the Last Year: Not on file  . Ran Out of Food in the Last Year: Not on file  Transportation Needs:   . Lack of Transportation (Medical): Not on file  . Lack of Transportation (Non-Medical): Not on file  Physical Activity:   . Days of Exercise per Week: Not on file  . Minutes of Exercise per Session: Not on file   Stress:   . Feeling of Stress : Not on file  Social Connections:   . Frequency of Communication with Friends and Family: Not on file  . Frequency of Social Gatherings with Friends and Family: Not on file  . Attends Religious Services: Not on file  . Active Member of Clubs or Organizations: Not on file  . Attends Archivist Meetings: Not on file  . Marital Status: Not on file  Intimate Partner Violence:   . Fear of Current or Ex-Partner: Not on file  . Emotionally Abused: Not on file  . Physically Abused: Not on file  . Sexually Abused: Not on file   Health Maintenance  Topic Date Due  . HIV Screening  01/14/1984  . TETANUS/TDAP  09/29/2017  . COLONOSCOPY  01/14/2019  . INFLUENZA VACCINE  04/30/2019  . PAP SMEAR-Modifier  05/27/2020  . MAMMOGRAM  04/07/2021       Review of Systems Pertinent items noted in HPI and remainder of comprehensive ROS otherwise negative.   Objective:  Blood pressure (!) 141/89, pulse 79, last menstrual period 09/28/2019.     GENERAL: Well-developed, well-nourished female in no acute distress.  HEENT: Normocephalic, atraumatic. Sclerae anicteric.  NECK: Supple. Normal thyroid.  LUNGS: Clear to auscultation bilaterally.  HEART: Regular rate and rhythm. BREASTS: Symmetric in size. No palpable masses or lymphadenopathy, skin changes, or nipple drainage. ABDOMEN: Soft, nontender, nondistended. No organomegaly. PELVIC: Normal external female genitalia. Vagina is pink and rugated.  Normal discharge. Normal appearing cervix. Uterus is normal in size. No adnexal mass or tenderness. EXTREMITIES: No cyanosis, clubbing, or edema, 2+ distal pulses.    Assessment:    Healthy female exam.      Plan:    Patient with normal pap smear in 04/2017. Patient currently self pay and will be referred to [facility] program Normal mammogram in July Repeat blood pressure remains elevated 140/90's. Discussed changing to a progesterone only birth control pills  which patient agrees to  See After Visit Summary for Counseling Recommendations

## 2019-09-28 NOTE — Progress Notes (Signed)
Pt is here for annual gyn exam. Last pap 05/27/2017 normal. Pt is currently abstinent. Last MMG was in July per patient, normal. Colonoscopy 2017, normal per patient.

## 2019-09-29 ENCOUNTER — Other Ambulatory Visit: Payer: Self-pay

## 2019-09-29 MED ORDER — TRIAMCINOLONE ACETONIDE 0.1 % EX CREA: TOPICAL_CREAM | CUTANEOUS | 0 refills | Status: DC

## 2019-09-29 MED ORDER — TRIAMCINOLONE ACETONIDE 0.1 % EX CREA: TOPICAL_CREAM | CUTANEOUS | 2 refills | Status: DC

## 2019-09-29 MED FILL — TRIAMCINOLONE ACETONIDE 0.1: 0.1 | 30 days supply | Qty: 80 | Fill #0

## 2019-10-03 ENCOUNTER — Telehealth: Payer: Self-pay

## 2019-10-03 NOTE — Telephone Encounter (Signed)
Return call to pt to confirm triage message regarding BCP's Pt was not ava left detailed message for pt to return call.

## 2019-10-04 ENCOUNTER — Telehealth: Payer: Self-pay

## 2019-10-04 ENCOUNTER — Other Ambulatory Visit: Payer: Self-pay | Admitting: Obstetrics and Gynecology

## 2019-10-04 MED ORDER — NORETHINDRONE 0.35 MG PO TABS: 1.0000 | ORAL_TABLET | Freq: Every day | ORAL | 4 refills | Status: DC

## 2019-10-04 MED FILL — NORETHINDRONE 0.35 MG TABS: 0.35 | 84 days supply | Qty: 84 | Fill #0

## 2019-10-04 NOTE — Telephone Encounter (Signed)
Recent Rx for Birth Control was $700 pt would like a different Rx to be prescribed that will not contribute to elevating her BP.  Please advise.

## 2019-10-04 NOTE — Telephone Encounter (Signed)
Pt notified and voiced understanding 

## 2019-10-13 MED FILL — NORETHINDRONE 0.35 MG TABS: 0.35 | 84 days supply | Qty: 84 | Fill #0

## 2020-01-25 MED FILL — NORETHINDRONE 0.35 MG TAB: 0.35 | 84 days supply | Qty: 84 | Fill #0

## 2020-01-26 ENCOUNTER — Encounter: Payer: Self-pay | Admitting: Internal Medicine

## 2020-01-26 ENCOUNTER — Ambulatory Visit (INDEPENDENT_AMBULATORY_CARE_PROVIDER_SITE_OTHER): Payer: Self-pay | Admitting: Internal Medicine

## 2020-01-26 ENCOUNTER — Other Ambulatory Visit: Payer: Self-pay

## 2020-01-26 VITALS — BP 124/88 | HR 83 | Temp 98.2°F | Ht 62.0 in | Wt 166.8 lb

## 2020-01-26 DIAGNOSIS — E559 Vitamin D deficiency, unspecified: Secondary | ICD-10-CM

## 2020-01-26 DIAGNOSIS — Z6830 Body mass index (BMI) 30.0-30.9, adult: Secondary | ICD-10-CM

## 2020-01-26 DIAGNOSIS — L309 Dermatitis, unspecified: Secondary | ICD-10-CM

## 2020-01-26 DIAGNOSIS — R030 Elevated blood-pressure reading, without diagnosis of hypertension: Secondary | ICD-10-CM

## 2020-01-26 DIAGNOSIS — E6609 Other obesity due to excess calories: Secondary | ICD-10-CM

## 2020-01-26 MED ORDER — HYDROCORTISONE 2.5 % EX CREA: TOPICAL_CREAM | CUTANEOUS | 3 refills | Status: DC

## 2020-01-26 MED ORDER — AZELASTINE HCL 0.1 % NA SOLN: 1.0000 | Freq: Two times a day (BID) | NASAL | 11 refills | Status: AC

## 2020-01-26 MED ORDER — TRIAMCINOLONE ACETONIDE 0.1 % EX CREA: TOPICAL_CREAM | CUTANEOUS | 2 refills | Status: DC

## 2020-01-26 MED FILL — HYDROCORTISONE 2.5% CREAM: 2.5 | 30 days supply | Qty: 60 | Fill #0

## 2020-01-26 NOTE — Patient Instructions (Signed)

## 2020-01-26 NOTE — Progress Notes (Signed)
This visit occurred during the SARS-CoV-2 public health emergency.  Safety protocols were in place, including screening questions prior to the visit, additional usage of staff PPE, and extensive cleaning of exam room while observing appropriate contact time as indicated for disinfecting solutions.  Subjective:     Patient ID: Frances Stewart , female    DOB: [birth year] , 49 y.o.   MRN: [id number]   Chief Complaint  Patient presents with  . Hypertension    HPI  She is here today for further evaluation of elevated BP. She reports that at other medical appts, she has had elevated BP. She denies chest pain and shortness of breath. She does have headaches on occasion, but also has h/o migraines. She denies previous h/o HTN, but she does have family history of HTN.     Past Medical History:  Diagnosis Date  . Allergic rhinitis   . Asthma   . Eczema      Family History  Problem Relation Age of Onset  . Hypertension Mother   . Hypertension Father   . Hyperlipidemia Father   . Heart disease Father   . Sarcoidosis Sister   . Hypertension Sister   . Breast cancer Neg Hx      Current Outpatient Medications:  .  ALBUTEROL IN, Inhale into the lungs as needed., Disp: , Rfl:  .  azelastine (ASTELIN) 0.1 % nasal spray, Place 1 spray into both nostrils 2 (two) times daily. Use in each nostril as directed, Disp: 30 mL, Rfl: 11 .  Blood Pressure Monitoring (BLOOD PRESSURE MONITOR/L CUFF) MISC, 1 kit by Does not apply route daily., Disp: 1 each, Rfl: 0 .  cetirizine (ZYRTEC) 10 MG tablet, Take 10 mg by mouth daily., Disp: , Rfl:  .  cholecalciferol (VITAMIN D) 1000 units tablet, Take 1,000 Units by mouth daily. , Disp: , Rfl:  .  cromolyn (OPTICROM) 4 % ophthalmic solution, prn, Disp: , Rfl: 3 .  hydrocortisone 2.5 % cream, APPLY ON THE FACE AS DIRECTED, Disp: 60 g, Rfl: 3 .  hydrOXYzine (ATARAX/VISTARIL) 10 MG tablet, Take 10 mg by mouth 3 (three) times daily as needed for itching., Disp: ,  Rfl:  .  Multiple Vitamin (MULTIVITAMIN) tablet, Take 1 tablet by mouth daily., Disp: , Rfl:  .  norethindrone (MICRONOR) 0.35 MG tablet, Take 1 tablet (0.35 mg total) by mouth daily., Disp: 3 Package, Rfl: 4 .  SM EYE ITCH RELIEF 0.025 % ophthalmic solution, PLACE 1 DROP IN EACH EYE TWICE A DAY, Disp: , Rfl: 3 .  triamcinolone cream (KENALOG) 0.1 %, APPLY A THIN LAYER TO THE AFFECTED AREA TWICE DAILY, Disp: 80 g, Rfl: 2   Allergies  Allergen Reactions  . Lanolin Rash  . Neomycin Rash     Review of Systems  Constitutional: Negative.   Respiratory: Negative.   Cardiovascular: Negative.   Gastrointestinal: Negative.   Skin: Positive for rash.       Has eczema flare. Needs refill of HC and triamcinolone creams.   Neurological: Negative.   Psychiatric/Behavioral: Negative.      Today's Vitals   01/26/20 1555  BP: 124/88  Pulse: 83  Temp: 98.2 F (36.8 C)  TempSrc: Oral  Weight: 166 lb 12.8 oz (75.7 kg)  Height: 5' 2"  (1.575 m)   Body mass index is 30.51 kg/m.   Wt Readings from Last 3 Encounters:  01/26/20 166 lb 12.8 oz (75.7 kg)  09/23/18 171 lb 9.6 oz (77.8 kg)  05/27/17 167 lb  3.2 oz (75.8 kg)     Objective:  Physical Exam Vitals and nursing note reviewed.  Constitutional:      Appearance: Normal appearance.  HENT:     Head: Normocephalic and atraumatic.  Cardiovascular:     Rate and Rhythm: Normal rate and regular rhythm.     Heart sounds: Normal heart sounds.  Pulmonary:     Effort: Pulmonary effort is normal.     Breath sounds: Normal breath sounds.  Skin:    General: Skin is warm.  Neurological:     General: No focal deficit present.     Mental Status: She is alert.  Psychiatric:        Mood and Affect: Mood normal.        Behavior: Behavior normal.         Assessment And Plan:     1. Elevated blood pressure reading  She agrees to [employer] as listed below. She plans to have this drawn in [place] when she returns to work. She will continue to  monitor her BP. Advised to start magnesium 421m nightly, in powder form. Also encouraged to increase her intake of potassium rich foods. She agrees to rto in 4-6 months for re-evaluation.   - CMP14+EGFR; Future - CBC with Diff; Future  2. Eczema, unspecified type  Chronic. She was given refills of HC cream and triamcinolone cream to apply to affected area twice daily as needed.   3. Vitamin D deficiency, unspecified  I WILL CHECK A VIT D LEVEL AND SUPPLEMENT AS NEEDED.  ALSO ENCOURAGED TO SPEND 15 MINUTES IN THE SUN DAILY.  - Vitamin D (25 hydroxy); Future  4. Class 1 obesity due to excess calories with serious comorbidity and body mass index (BMI) of 30.0 to 30.9 in adult  She has lost five pounds since her last visit in 2019. She is participating in a regular exercise program and encouraged to keep up the great work.    RMaximino Greenland MD    THE PATIENT IS ENCOURAGED TO PRACTICE SOCIAL DISTANCING DUE TO THE COVID-19 PANDEMIC.

## 2020-01-27 MED FILL — TRIAMCINOLONE 0.1% CREAM: 0.1 | 14 days supply | Qty: 80 | Fill #0

## 2020-04-13 ENCOUNTER — Other Ambulatory Visit: Payer: Self-pay | Admitting: Obstetrics and Gynecology

## 2020-04-13 DIAGNOSIS — Z1231 Encounter for screening mammogram for malignant neoplasm of breast: Secondary | ICD-10-CM

## 2020-05-25 ENCOUNTER — Ambulatory Visit: Payer: Self-pay

## 2020-06-22 MED FILL — TRIAMCINOLONE 0.1% CREAM: 0.1 | 14 days supply | Qty: 80 | Fill #1

## 2020-06-22 MED FILL — NORETHINDRONE 0.35 MG TAB: 0.35 | 84 days supply | Qty: 84 | Fill #1

## 2020-06-25 ENCOUNTER — Ambulatory Visit
Admission: RE | Admit: 2020-06-25 | Discharge: 2020-06-25 | Disposition: A | Discharge status: Home / Self Care | Payer: No Typology Code available for payment source | Source: Ambulatory Visit | Attending: Obstetrics and Gynecology | Admitting: Obstetrics and Gynecology

## 2020-06-25 ENCOUNTER — Other Ambulatory Visit: Payer: Self-pay

## 2020-06-25 DIAGNOSIS — Z1231 Encounter for screening mammogram for malignant neoplasm of breast: Secondary | ICD-10-CM

## 2020-07-23 ENCOUNTER — Other Ambulatory Visit: Payer: Self-pay

## 2020-07-23 DIAGNOSIS — R768 Other specified abnormal immunological findings in serum: Secondary | ICD-10-CM

## 2020-07-24 ENCOUNTER — Other Ambulatory Visit: Payer: Self-pay | Admitting: Internal Medicine

## 2020-07-24 ENCOUNTER — Other Ambulatory Visit: Payer: Self-pay

## 2020-07-25 LAB — CBC WITH DIFFERENTIAL/PLATELET
Basophils Absolute: 0.1 10*3/uL (ref 0.0–0.2)
Basos: 2 %
EOS (ABSOLUTE): 0.2 10*3/uL (ref 0.0–0.4)
Eos: 4 %
Hematocrit: 34.9 % (ref 34.0–46.6)
Hemoglobin: 11.4 g/dL (ref 11.1–15.9)
Immature Grans (Abs): 0 10*3/uL (ref 0.0–0.1)
Immature Granulocytes: 0 %
Lymphocytes Absolute: 1.2 10*3/uL (ref 0.7–3.1)
Lymphs: 28 %
MCH: 30.2 pg (ref 26.6–33.0)
MCHC: 32.7 g/dL (ref 31.5–35.7)
MCV: 92 fL (ref 79–97)
Monocytes Absolute: 0.3 10*3/uL (ref 0.1–0.9)
Monocytes: 8 %
Neutrophils Absolute: 2.3 10*3/uL (ref 1.4–7.0)
Neutrophils: 58 %
Platelets: 290 10*3/uL (ref 150–450)
RBC: 3.78 x10E6/uL (ref 3.77–5.28)
RDW: 14 % (ref 11.7–15.4)
WBC: 4.1 10*3/uL (ref 3.4–10.8)

## 2020-07-25 LAB — CMP14+EGFR
ALT: 10 IU/L (ref 0–32)
AST: 19 IU/L (ref 0–40)
Albumin/Globulin Ratio: 2.1 (ref 1.2–2.2)
Albumin: 4.5 g/dL (ref 3.8–4.9)
Alkaline Phosphatase: 73 IU/L (ref 44–121)
BUN/Creatinine Ratio: 9 (ref 9–23)
BUN: 9 mg/dL (ref 6–24)
Bilirubin Total: 0.2 mg/dL (ref 0.0–1.2)
CO2: 21 mmol/L (ref 20–29)
Calcium: 8.6 mg/dL — ABNORMAL LOW (ref 8.7–10.2)
Chloride: 106 mmol/L (ref 96–106)
Creatinine, Ser: 0.98 mg/dL (ref 0.57–1.00)
GFR calc Af Amer: 77 mL/min/{1.73_m2} (ref >59)
GFR calc non Af Amer: 67 mL/min/{1.73_m2} (ref >59)
Globulin, Total: 2.1 g/dL (ref 1.5–4.5)
Glucose: 82 mg/dL (ref 65–99)
Potassium: 4.2 mmol/L (ref 3.5–5.2)
Sodium: 140 mmol/L (ref 134–144)
Total Protein: 6.6 g/dL (ref 6.0–8.5)

## 2020-07-25 LAB — SARS-COV-2 SEMI-QUANTITATIVE TOTAL ANTIBODY, SPIKE
SARS-CoV-2 Semi-Quant Total Ab: 818.2 U/mL (ref <0.8)
SARS-CoV-2 Spike Ab Interp: POSITIVE

## 2020-07-25 LAB — VITAMIN D 25 HYDROXY (VIT D DEFICIENCY, FRACTURES): Vit D, 25-Hydroxy: 18 ng/mL — ABNORMAL LOW (ref 30.0–100.0)

## 2020-07-30 ENCOUNTER — Other Ambulatory Visit: Payer: Self-pay | Admitting: Internal Medicine

## 2020-07-30 MED ORDER — VITAMIN D (ERGOCALCIFEROL) 1.25 MG (50000 UNIT) PO CAPS: ORAL_CAPSULE | ORAL | 0 refills | Status: DC

## 2020-07-30 MED FILL — VIT D2 1.25 MG (50,000 UNIT: 1.25 MG | 84 days supply | Qty: 24 | Fill #0

## 2020-10-11 ENCOUNTER — Ambulatory Visit: Payer: Self-pay | Admitting: Certified Nurse Midwife

## 2020-10-19 ENCOUNTER — Ambulatory Visit: Payer: Self-pay | Admitting: Obstetrics & Gynecology

## 2020-11-12 ENCOUNTER — Ambulatory Visit: Payer: Self-pay | Admitting: Advanced Practice Midwife

## 2020-11-15 ENCOUNTER — Encounter: Payer: Self-pay | Admitting: Obstetrics and Gynecology

## 2020-11-15 ENCOUNTER — Other Ambulatory Visit: Payer: Self-pay

## 2020-11-15 ENCOUNTER — Other Ambulatory Visit (HOSPITAL_COMMUNITY)
Admission: RE | Admit: 2020-11-15 | Discharge: 2020-11-15 | Disposition: A | Discharge status: Home / Self Care | Payer: Self-pay | Source: Ambulatory Visit | Attending: Obstetrics and Gynecology | Admitting: Obstetrics and Gynecology

## 2020-11-15 ENCOUNTER — Other Ambulatory Visit: Payer: Self-pay | Admitting: Obstetrics and Gynecology

## 2020-11-15 ENCOUNTER — Ambulatory Visit (INDEPENDENT_AMBULATORY_CARE_PROVIDER_SITE_OTHER): Payer: Self-pay | Admitting: Obstetrics and Gynecology

## 2020-11-15 VITALS — BP 135/90 | HR 76 | Ht 62.0 in | Wt 171.0 lb

## 2020-11-15 DIAGNOSIS — Z01419 Encounter for gynecological examination (general) (routine) without abnormal findings: Secondary | ICD-10-CM

## 2020-11-15 MED ORDER — NORETHINDRONE 0.35 MG PO TABS: 1.0000 | ORAL_TABLET | Freq: Every day | ORAL | 4 refills | Status: DC

## 2020-11-15 NOTE — Progress Notes (Signed)
GYNECOLOGY ANNUAL PREVENTATIVE CARE ENCOUNTER NOTE  History:     Frances Stewart is a 50 y.o. G0P0000 female here for a routine annual gynecologic exam.  Current complaints: None.   Denies abnormal vaginal bleeding, discharge, pelvic pain, problems with intercourse or other gynecologic concerns.    Gynecologic History Patient's last menstrual period was 11/04/2020 (approximate). Contraception: OCP (estrogen/progesterone) Last Pap: 2018. Results were: normal with negative HPV Last mammogram: 2021. Results were: normal  Obstetric History OB History  Gravida Para Term Preterm AB Living  0 0 0 0 0 0  SAB IAB Ectopic Multiple Live Births  0 0 0 0      Past Medical History:  Diagnosis Date  . Allergic rhinitis   . Asthma   . Eczema     Past Surgical History:  Procedure Laterality Date  . REFRACTIVE SURGERY  2008    Current Outpatient Medications on File Prior to Visit  Medication Sig Dispense Refill  . ALBUTEROL IN Inhale into the lungs as needed.    Marland Kitchen azelastine (ASTELIN) 0.1 % nasal spray Place 1 spray into both nostrils 2 (two) times daily. Use in each nostril as directed 30 mL 11  . cetirizine (ZYRTEC) 10 MG tablet Take 10 mg by mouth daily.    . cholecalciferol (VITAMIN D) 1000 units tablet Take 1,000 Units by mouth daily.     . cromolyn (OPTICROM) 4 % ophthalmic solution prn  3  . hydrocortisone 2.5 % cream APPLY ON THE FACE AS DIRECTED 60 g 3  . Multiple Vitamin (MULTIVITAMIN) tablet Take 1 tablet by mouth daily.    . norethindrone (MICRONOR) 0.35 MG tablet Take 1 tablet (0.35 mg total) by mouth daily. 3 Package 4  . SM EYE ITCH RELIEF 0.025 % ophthalmic solution PLACE 1 DROP IN EACH EYE TWICE A DAY  3  . triamcinolone cream (KENALOG) 0.1 % APPLY A THIN LAYER TO THE AFFECTED AREA TWICE DAILY 80 g 2  . Vitamin D, Ergocalciferol, (DRISDOL) 1.25 MG (50000 UNIT) CAPS capsule Take one capsule po twice weekly on Tuesdays/Fridays 24 capsule 0  . Blood Pressure Monitoring  (BLOOD PRESSURE MONITOR/L CUFF) MISC 1 kit by Does not apply route daily. 1 each 0  . hydrOXYzine (ATARAX/VISTARIL) 10 MG tablet Take 10 mg by mouth 3 (three) times daily as needed for itching. (Patient not taking: Reported on 11/15/2020)     No current facility-administered medications on file prior to visit.    Allergies  Allergen Reactions  . Lanolin Rash  . Neomycin Rash    Social History:  reports that she has never smoked. She has never used smokeless tobacco. She reports current alcohol use of about 1.0 standard drink of alcohol per week. She reports that she does not use drugs.  Family History  Problem Relation Age of Onset  . Hypertension Mother   . Hypertension Father   . Hyperlipidemia Father   . Heart disease Father   . Sarcoidosis Sister   . Hypertension Sister   . Breast cancer Neg Hx     The following portions of the patient's history were reviewed and updated as appropriate: allergies, current medications, past family history, past medical history, past social history, past surgical history and problem list.  Review of Systems Pertinent items noted in HPI and remainder of comprehensive ROS otherwise negative.  Physical Exam:  BP 135/90   Pulse 76   Ht 5' 2"  (1.575 m)   Wt 171 lb (77.6 kg)  LMP 11/04/2020 (Approximate)   BMI 31.28 kg/m  CONSTITUTIONAL: Well-developed, well-nourished female in no acute distress.  HENT:  Normocephalic, atraumatic, External right and left ear normal.  EYES: Conjunctivae and EOM are normal. Pupils are equal, round, and reactive to light. No scleral icterus.  NECK: Normal range of motion, supple, no masses.  Normal thyroid.  SKIN: Skin is warm and dry. No rash noted. Not diaphoretic. No erythema. No pallor. MUSCULOSKELETAL: Normal range of motion. No tenderness.  No cyanosis, clubbing, or edema. NEUROLOGIC: Alert and oriented to person, place, and time. Normal reflexes, muscle tone coordination.  PSYCHIATRIC: Normal mood and  affect. Normal behavior. Normal judgment and thought content. CARDIOVASCULAR: Normal heart rate noted, regular rhythm RESPIRATORY: Clear to auscultation bilaterally. Effort and breath sounds normal, no problems with respiration noted. BREASTS: Symmetric in size. No masses, tenderness, skin changes, nipple drainage, or lymphadenopathy bilaterally. Performed in the presence of a chaperone. ABDOMEN: Soft, no distention noted.  No tenderness, rebound or guarding.  PELVIC: Normal appearing external genitalia and urethral meatus; normal appearing vaginal mucosa and cervix.  No abnormal discharge noted.  Pap smear obtained.  Normal uterine size, no other palpable masses, no uterine or adnexal tenderness.  Performed in the presence of a chaperone.   Assessment and Plan:   1. Women's annual routine gynecological examination  - Cytology - PAP( [place]) - MM Digital Screening; Future  Will follow up results of pap smear and manage accordingly. Mammogram scheduled Routine preventative health maintenance measures emphasized. Please refer to After Visit Summary for other counseling recommendations.       Frances Stewart, Frances Stewart, Frances Stewart for [name], [name]

## 2020-11-15 NOTE — Progress Notes (Signed)
Patient presents for Annual Exam.  Pt has had COVID Vaccines and booster.  Last pap:05/27/2017 WNL STD Screening: Declined  Mammogram: 06/26/2020 Repeat in 1 yr.  CC: None

## 2020-11-15 NOTE — Patient Instructions (Signed)

## 2020-11-16 ENCOUNTER — Other Ambulatory Visit: Payer: Self-pay | Admitting: Obstetrics and Gynecology

## 2020-11-16 ENCOUNTER — Other Ambulatory Visit: Payer: Self-pay

## 2020-11-16 MED FILL — NORLYDA 0.35 MG TABS: 0.35 | 84 days supply | Qty: 84 | Fill #0

## 2020-11-16 NOTE — Progress Notes (Signed)
error 

## 2020-11-16 NOTE — Telephone Encounter (Signed)
Returned call, advised that rx was sent with provider approval.

## 2020-11-19 LAB — CYTOLOGY - PAP
Comment: NEGATIVE
Diagnosis: NEGATIVE
High risk HPV: NEGATIVE

## 2021-03-08 ENCOUNTER — Other Ambulatory Visit (HOSPITAL_COMMUNITY): Payer: Self-pay

## 2021-03-08 MED FILL — Norethindrone Tab 0.35 MG: ORAL | 84 days supply | Qty: 84 | Fill #0 | Status: AC

## 2021-03-11 ENCOUNTER — Other Ambulatory Visit: Payer: Self-pay | Admitting: Internal Medicine

## 2021-03-11 ENCOUNTER — Other Ambulatory Visit (HOSPITAL_COMMUNITY): Payer: Self-pay

## 2021-06-24 ENCOUNTER — Other Ambulatory Visit (HOSPITAL_COMMUNITY): Payer: Self-pay

## 2021-06-24 MED ORDER — OLOPATADINE HCL 0.2 % OP SOLN
1.0000 [drp] | Freq: Every day | OPHTHALMIC | 3 refills | Status: AC
  Filled 2021-06-24: qty 2.5, 25d supply, fill #0

## 2021-07-03 ENCOUNTER — Other Ambulatory Visit (HOSPITAL_COMMUNITY): Payer: Self-pay

## 2021-07-17 ENCOUNTER — Ambulatory Visit: Payer: Self-pay

## 2021-07-18 ENCOUNTER — Other Ambulatory Visit: Payer: Self-pay

## 2021-07-18 ENCOUNTER — Ambulatory Visit
Admission: RE | Admit: 2021-07-18 | Discharge: 2021-07-18 | Disposition: A | Discharge status: Home / Self Care | Payer: No Typology Code available for payment source | Source: Ambulatory Visit | Attending: Obstetrics and Gynecology | Admitting: Obstetrics and Gynecology

## 2021-07-18 DIAGNOSIS — Z01419 Encounter for gynecological examination (general) (routine) without abnormal findings: Secondary | ICD-10-CM

## 2021-10-14 LAB — LIPID PANEL
Cholesterol: 248 — AB (ref 0–200)
HDL: 61 (ref 35–70)
LDL Cholesterol: 172
LDl/HDL Ratio: 4.1
Triglycerides: 55 (ref 40–160)

## 2021-10-14 LAB — VITAMIN D 25 HYDROXY (VIT D DEFICIENCY, FRACTURES): Vit D, 25-Hydroxy: 20

## 2021-10-14 LAB — HEPATIC FUNCTION PANEL
ALT: 11 U/L (ref 7–35)
AST: 19 (ref 13–35)
Alkaline Phosphatase: 76 (ref 25–125)
Bilirubin, Direct: 0.1 (ref 0.01–0.4)
Bilirubin, Total: 0.6

## 2021-10-14 LAB — CBC: RBC: 4.22 (ref 3.87–5.11)

## 2021-10-14 LAB — CBC AND DIFFERENTIAL
HCT: 39 (ref 36–46)
Hemoglobin: 13.3 (ref 12.0–16.0)
Platelets: 310 10*3/uL (ref 150–400)
WBC: 3.7

## 2021-10-14 LAB — IRON,TIBC AND FERRITIN PANEL
%SAT: 29
Ferritin: 12
Iron: 115
TIBC: 397

## 2021-10-14 LAB — HEMOGLOBIN A1C: Hemoglobin A1C: 5.1

## 2021-10-14 LAB — COMPREHENSIVE METABOLIC PANEL
Albumin: 4.3 (ref 3.5–5.0)
Calcium: 9.3 (ref 8.7–10.7)
Globulin: 2.7
eGFR: 66

## 2021-10-14 LAB — BASIC METABOLIC PANEL
Creatinine: 1 (ref 0.5–1.1)
Glucose: 81

## 2021-10-14 LAB — TSH: TSH: 1.97 (ref 0.41–5.90)

## 2021-11-08 ENCOUNTER — Other Ambulatory Visit (HOSPITAL_COMMUNITY): Payer: Self-pay

## 2021-11-08 MED FILL — Norethindrone Tab 0.35 MG: ORAL | 84 days supply | Qty: 84 | Fill #1 | Status: AC

## 2021-12-09 ENCOUNTER — Ambulatory Visit: Payer: No Typology Code available for payment source | Admitting: Obstetrics and Gynecology

## 2022-02-06 ENCOUNTER — Ambulatory Visit: Payer: No Typology Code available for payment source | Admitting: Student

## 2022-02-27 ENCOUNTER — Ambulatory Visit: Payer: No Typology Code available for payment source | Admitting: Women's Health

## 2022-03-03 ENCOUNTER — Encounter: Payer: Self-pay | Admitting: Internal Medicine

## 2022-03-03 ENCOUNTER — Ambulatory Visit (INDEPENDENT_AMBULATORY_CARE_PROVIDER_SITE_OTHER): Payer: Self-pay | Admitting: Internal Medicine

## 2022-03-03 ENCOUNTER — Other Ambulatory Visit (HOSPITAL_COMMUNITY): Payer: Self-pay

## 2022-03-03 ENCOUNTER — Other Ambulatory Visit: Payer: Self-pay

## 2022-03-03 VITALS — BP 116/78 | HR 67 | Temp 98.0°F | Ht 61.2 in | Wt 167.6 lb

## 2022-03-03 DIAGNOSIS — E6609 Other obesity due to excess calories: Secondary | ICD-10-CM

## 2022-03-03 DIAGNOSIS — E785 Hyperlipidemia, unspecified: Secondary | ICD-10-CM

## 2022-03-03 DIAGNOSIS — Z6831 Body mass index (BMI) 31.0-31.9, adult: Secondary | ICD-10-CM

## 2022-03-03 DIAGNOSIS — R030 Elevated blood-pressure reading, without diagnosis of hypertension: Secondary | ICD-10-CM

## 2022-03-03 DIAGNOSIS — G5711 Meralgia paresthetica, right lower limb: Secondary | ICD-10-CM

## 2022-03-03 DIAGNOSIS — Z23 Encounter for immunization: Secondary | ICD-10-CM

## 2022-03-03 DIAGNOSIS — L309 Dermatitis, unspecified: Secondary | ICD-10-CM

## 2022-03-03 MED ORDER — TRIAMCINOLONE ACETONIDE 0.1 % EX CREA
TOPICAL_CREAM | CUTANEOUS | 2 refills | Status: DC
Start: 2022-03-03 — End: 2022-03-03

## 2022-03-03 MED ORDER — PIMECROLIMUS 1 % EX CREA
1.0000 | TOPICAL_CREAM | Freq: Every day | CUTANEOUS | 2 refills | Status: DC | PRN
Start: 2022-03-03 — End: 2023-07-21

## 2022-03-03 MED ORDER — SHINGRIX 50 MCG/0.5ML IM SUSR: 0.5000 mL | Freq: Once | INTRAMUSCULAR | 0 refills | Status: AC

## 2022-03-03 MED ORDER — TRIAMCINOLONE ACETONIDE 0.1 % EX CREA
TOPICAL_CREAM | CUTANEOUS | 2 refills | Status: DC
Start: 2022-03-03 — End: 2023-07-21

## 2022-03-03 MED ORDER — SHINGRIX 50 MCG/0.5ML IM SUSR
0.5000 mL | Freq: Once | INTRAMUSCULAR | 0 refills | Status: DC
  Filled 2022-03-03: qty 0.5, 1d supply, fill #0

## 2022-03-03 NOTE — Progress Notes (Signed)
Rich Brave [place],acting as a Education administrator for Maximino Greenland, MD.,have documented all relevant documentation on the behalf of Maximino Greenland, MD,as directed by  Maximino Greenland, MD while in the presence of Maximino Greenland, MD.  This visit occurred during the SARS-CoV-2 public health emergency.  Safety protocols were in place, including screening questions prior to the visit, additional usage of staff PPE, and extensive cleaning of exam room while observing appropriate contact time as indicated for disinfecting solutions.  Subjective:     Patient ID: Frances Stewart , female    DOB: March 15, 1971 , 51 y.o.   MRN: [id number]   Chief Complaint  Patient presents with   Hypertension    HPI  She is here today for further evaluation of elevated BP.  She would also like a prescription for Elidel and triamcinolone for her ezcema  She has no specific complaints at this time. Admits to episode of sunburn when participating in triathlon in April 2023. This is slowly resolving.      Past Medical History:  Diagnosis Date   Allergic rhinitis    Asthma    Eczema      Family History  Problem Relation Age of Onset   Hypertension Mother    Hypertension Father    Hyperlipidemia Father    Heart disease Father    Sarcoidosis Sister    Hypertension Sister    Breast cancer Neg Hx      Current Outpatient Medications:    ALBUTEROL IN, Inhale into the lungs as needed., Disp: , Rfl:    azelastine (ASTELIN) 0.1 % nasal spray, Place 1 spray into both nostrils 2 (two) times daily. Use in each nostril as directed, Disp: 30 mL, Rfl: 11   cetirizine (ZYRTEC) 10 MG tablet, Take 10 mg by mouth daily., Disp: , Rfl:    cholecalciferol (VITAMIN D) 1000 units tablet, Take 1,000 Units by mouth daily. , Disp: , Rfl:    cromolyn (OPTICROM) 4 % ophthalmic solution, prn, Disp: , Rfl: 3   hydrocortisone 2.5 % cream, APPLY ON THE FACE AS DIRECTED, Disp: 60 g, Rfl: 3   Multiple Vitamin (MULTIVITAMIN) tablet, Take 1  tablet by mouth daily., Disp: , Rfl:    norethindrone (MICRONOR) 0.35 MG tablet, TAKE 1 TABLET BY MOUTH ONCE DAILY, Disp: 84 tablet, Rfl: 3   Olopatadine HCl 0.2 % SOLN, Place 1 drop into both eyes daily., Disp: 5 mL, Rfl: 3   SM EYE ITCH RELIEF 0.025 % ophthalmic solution, PLACE 1 DROP IN EACH EYE TWICE A DAY, Disp: , Rfl: 3   bisacodyl (DULCOLAX PINK LAXATIVE) 5 MG EC tablet, Dulcolax Laxative, Disp: , Rfl:    Blood Pressure Monitoring (BLOOD PRESSURE MONITOR/L CUFF) MISC, 1 kit by Does not apply route daily., Disp: 1 each, Rfl: 0   hydrOXYzine (ATARAX/VISTARIL) 10 MG tablet, Take 10 mg by mouth 3 (three) times daily as needed for itching., Disp: , Rfl:    Magnesium 100 MG CAPS, Magnesium, Disp: , Rfl:    norgestimate-ethinyl estradiol (ORTHO-CYCLEN) 0.25-35 MG-MCG tablet, Take 1 tablet by mouth daily., Disp: 84 tablet, Rfl: 3   pimecrolimus (ELIDEL) 1 % cream, Apply 1 application. topically daily as needed., Disp: 100 g, Rfl: 2   triamcinolone cream (KENALOG) 0.1 %, APPLY A THIN LAYER TO THE AFFECTED AREA TWICE DAILY, Disp: 80 g, Rfl: 2   Allergies  Allergen Reactions   Lanolin Rash   Neomycin Rash     Review of Systems  Constitutional: Negative.  Respiratory: Negative.    Cardiovascular: Negative.   Skin:  Positive for rash.       She reports h/o eczema. States recent sunburn has aggravated her sx. She would like rx triamcinolone.   Neurological:  Positive for numbness.  Psychiatric/Behavioral: Negative.       Today's Vitals   03/03/22 0932  BP: 116/78  Pulse: 67  Temp: 98 F (36.7 C)  SpO2: 97%  Weight: 167 lb 9.6 oz (76 kg)  Height: 5' 1.2" (1.554 m)  PainSc: 0-No pain   Body mass index is 31.46 kg/m.  Wt Readings from Last 3 Encounters:  03/06/22 165 lb 12.8 oz (75.2 kg)  03/03/22 167 lb 9.6 oz (76 kg)  11/15/20 171 lb (77.6 kg)     Objective:  Physical Exam Vitals and nursing note reviewed.  Constitutional:      Appearance: Normal appearance.  HENT:      Head: Normocephalic and atraumatic.  Eyes:     Extraocular Movements: Extraocular movements intact.  Cardiovascular:     Rate and Rhythm: Normal rate and regular rhythm.     Heart sounds: Normal heart sounds.  Pulmonary:     Effort: Pulmonary effort is normal.     Breath sounds: Normal breath sounds.  Musculoskeletal:     Cervical back: Normal range of motion.  Skin:    General: Skin is warm.     Comments: Fine, flesh-colored maculopapular rash on LUE and LLE. Tan is present  Neurological:     General: No focal deficit present.     Mental Status: She is alert.  Psychiatric:        Mood and Affect: Mood normal.        Behavior: Behavior normal.       Assessment And Plan:     1. Elevated blood pressure reading Comments: This has improved. She was congratulated on her lifestyle changes and encouraged to keep up the great work!  2. Dyslipidemia Comments: Labs reviewed from Wellness visit Jan 2023. Results will be abstracted into her chart. ASCVD risk is 1.8% based on this reading.   3. Eczema, unspecified type Comments: She was given refills of both Elidel and triamcinolone as requested.  She agrees to wear sunscreen more regularly.   4. Meralgia paresthetica of right side Comments: Advised lidocaine patches may be helpful. If persistent, may consider gabapentin.   5. Class 1 obesity due to excess calories with serious comorbidity and body mass index (BMI) of 31.0 to 31.9 in adult Comments: She was congratulated on her 12 lb weight loss since Jan 2023. She is encouraged to keep up the great work!   6. Immunization due Comments: She was given rx Zostavax to get at local pharmacy.   Patient was given opportunity to ask questions. Patient verbalized understanding of the plan and was able to repeat key elements of the plan. All questions were answered to their satisfaction.   I, Maximino Greenland, MD, have reviewed all documentation for this visit. The documentation on 03/03/22 for  the exam, diagnosis, procedures, and orders are all accurate and complete.   IF YOU HAVE BEEN REFERRED TO A SPECIALIST, IT MAY TAKE 1-2 WEEKS TO SCHEDULE/PROCESS THE REFERRAL. IF YOU HAVE NOT HEARD FROM US/SPECIALIST IN TWO WEEKS, PLEASE GIVE [place] A CALL AT [phone].   THE PATIENT IS ENCOURAGED TO PRACTICE SOCIAL DISTANCING DUE TO THE COVID-19 PANDEMIC.

## 2022-03-03 NOTE — Patient Instructions (Signed)
Meralgia paresthetica  Hip opening exercises/pelvic floor exercise  Zoster Vaccine, Recombinant injection What is this medication? ZOSTER VACCINE (ZOS ter vak SEEN) is a vaccine used to reduce the risk of getting shingles. This vaccine is not used to treat shingles or nerve pain from shingles. This medicine may be used for other purposes; ask your health care provider or pharmacist if you have questions. COMMON BRAND NAME(S): [facility] What should I tell my care team before I take this medication? They need to know if you have any of these conditions: cancer immune system problems an unusual or allergic reaction to Zoster vaccine, other medications, foods, dyes, or preservatives pregnant or trying to get pregnant breast-feeding How should I use this medication? This vaccine is injected into a muscle. It is given by a health care provider. A copy of Vaccine Information Statements will be given before each vaccination. Be sure to read this information carefully each time. This sheet may change often. Talk to your health care provider about the use of this vaccine in children. This vaccine is not approved for use in children. Overdosage: If you think you have taken too much of this medicine contact a poison control center or emergency room at once. NOTE: This medicine is only for you. Do not share this medicine with others. What if I miss a dose? Keep appointments for follow-up (booster) doses. It is important not to miss your dose. Call your health care provider if you are unable to keep an appointment. What may interact with this medication? medicines that suppress your immune system medicines to treat cancer steroid medicines like prednisone or cortisone This list may not describe all possible interactions. Give your health care provider a list of all the medicines, herbs, non-prescription drugs, or dietary supplements you use. Also tell them if you smoke, drink alcohol, or use illegal  drugs. Some items may interact with your medicine. What should I watch for while using this medication? Visit your health care provider regularly. This vaccine, like all vaccines, may not fully protect everyone. What side effects may I notice from receiving this medication? Side effects that you should report to your doctor or health care professional as soon as possible: allergic reactions (skin rash, itching or hives; swelling of the face, lips, or tongue) trouble breathing Side effects that usually do not require medical attention (report these to your doctor or health care professional if they continue or are bothersome): chills headache fever nausea pain, redness, or irritation at site where injected tiredness vomiting This list may not describe all possible side effects. Call your doctor for medical advice about side effects. You may report side effects to FDA at 1-800-FDA-1088. Where should I keep my medication? This vaccine is only given by a health care provider. It will not be stored at home. NOTE: This sheet is a summary. It may not cover all possible information. If you have questions about this medicine, talk to your doctor, pharmacist, or health care provider.  2023 Elsevier/Gold Standard (2021-08-16 00:00:00)

## 2022-03-06 ENCOUNTER — Encounter: Payer: Self-pay | Admitting: Advanced Practice Midwife

## 2022-03-06 ENCOUNTER — Ambulatory Visit (INDEPENDENT_AMBULATORY_CARE_PROVIDER_SITE_OTHER): Payer: Self-pay | Admitting: Advanced Practice Midwife

## 2022-03-06 VITALS — BP 129/87 | HR 65 | Ht 62.0 in | Wt 165.8 lb

## 2022-03-06 DIAGNOSIS — Z01419 Encounter for gynecological examination (general) (routine) without abnormal findings: Secondary | ICD-10-CM

## 2022-03-06 DIAGNOSIS — R634 Abnormal weight loss: Secondary | ICD-10-CM

## 2022-03-06 MED ORDER — NORGESTIMATE-ETH ESTRADIOL 0.25-35 MG-MCG PO TABS: 1.0000 | ORAL_TABLET | Freq: Every day | ORAL | 3 refills | Status: DC

## 2022-03-06 MED ORDER — NORGESTIMATE-ETH ESTRADIOL 0.25-35 MG-MCG PO TABS: 1.0000 | ORAL_TABLET | Freq: Every day | ORAL | 11 refills | Status: DC

## 2022-03-06 NOTE — Progress Notes (Signed)
Patient presents for a AEX. Patient is self pay. Patient denies having any abnormal vaginal symptoms.  Patient would like to discuss having two periods a month even while taking ocp.   Last Pap: 11/15/20 Normal Last MM: 07/22/21 Normal

## 2022-03-07 DIAGNOSIS — Z01419 Encounter for gynecological examination (general) (routine) without abnormal findings: Secondary | ICD-10-CM | POA: Insufficient documentation

## 2022-03-07 DIAGNOSIS — R634 Abnormal weight loss: Secondary | ICD-10-CM | POA: Insufficient documentation

## 2022-03-07 NOTE — Progress Notes (Signed)
GYNECOLOGY ANNUAL PREVENTATIVE CARE ENCOUNTER NOTE  History:     Frances Stewart is a 51 y.o. G0P0000 female here for a routine annual gynecologic exam.  Current complaints: patient using Micronor for management of her menstrual cycle. She is experiencing two episodes of bleeding per month and would like to discuss moving to a different OCP.   Denies abnormal vaginal bleeding, discharge, pelvic pain, problems with intercourse or other gynecologic concerns.    Gynecologic History Patient's last menstrual period was 02/27/2022. Contraception:  perimenopausal, not currently sexually active Last Pap: 11/15/2020. Result was normal with negative HPV Last Mammogram: 06/2021.  Result was normal   Obstetric History OB History  Gravida Para Term Preterm AB Living  0 0 0 0 0 0  SAB IAB Ectopic Multiple Live Births  0 0 0 0      Past Medical History:  Diagnosis Date   Allergic rhinitis    Asthma    Eczema     Past Surgical History:  Procedure Laterality Date   REFRACTIVE SURGERY  2008    Current Outpatient Medications on File Prior to Visit  Medication Sig Dispense Refill   ALBUTEROL IN Inhale into the lungs as needed.     azelastine (ASTELIN) 0.1 % nasal spray Place 1 spray into both nostrils 2 (two) times daily. Use in each nostril as directed 30 mL 11   bisacodyl (DULCOLAX PINK LAXATIVE) 5 MG EC tablet Dulcolax Laxative     Blood Pressure Monitoring (BLOOD PRESSURE MONITOR/L CUFF) MISC 1 kit by Does not apply route daily. 1 each 0   cetirizine (ZYRTEC) 10 MG tablet Take 10 mg by mouth daily.     cholecalciferol (VITAMIN D) 1000 units tablet Take 1,000 Units by mouth daily.      cromolyn (OPTICROM) 4 % ophthalmic solution prn  3   hydrocortisone 2.5 % cream APPLY ON THE FACE AS DIRECTED 60 g 3   hydrOXYzine (ATARAX/VISTARIL) 10 MG tablet Take 10 mg by mouth 3 (three) times daily as needed for itching.     Magnesium 100 MG CAPS Magnesium     Multiple Vitamin (MULTIVITAMIN)  tablet Take 1 tablet by mouth daily.     Olopatadine HCl 0.2 % SOLN Place 1 drop into both eyes daily. 5 mL 3   pimecrolimus (ELIDEL) 1 % cream Apply 1 application. topically daily as needed. 100 g 2   SM EYE ITCH RELIEF 0.025 % ophthalmic solution PLACE 1 DROP IN EACH EYE TWICE A DAY  3   triamcinolone cream (KENALOG) 0.1 % APPLY A THIN LAYER TO THE AFFECTED AREA TWICE DAILY 80 g 2   norethindrone (MICRONOR) 0.35 MG tablet TAKE 1 TABLET BY MOUTH ONCE DAILY 84 tablet 3   No current facility-administered medications on file prior to visit.    Allergies  Allergen Reactions   Lanolin Rash   Neomycin Rash    Social History:  reports that she has never smoked. She has never used smokeless tobacco. She reports current alcohol use of about 1.0 standard drink of alcohol per week. She reports that she does not use drugs.  Family History  Problem Relation Age of Onset   Hypertension Mother    Hypertension Father    Hyperlipidemia Father    Heart disease Father    Sarcoidosis Sister    Hypertension Sister    Breast cancer Neg Hx     The following portions of the patient's history were reviewed and updated as appropriate: allergies, current  medications, past family history, past medical history, past social history, past surgical history and problem list.  Review of Systems Pertinent items noted in HPI and remainder of comprehensive ROS otherwise negative.  Physical Exam:  BP 129/87   Pulse 65   Ht 5' 2" (1.575 m)   Wt 165 lb 12.8 oz (75.2 kg)   LMP 02/27/2022   BMI 30.33 kg/m  CONSTITUTIONAL: Well-developed, well-nourished female in no acute distress.  HENT:  Normocephalic, atraumatic, External right and left ear normal.  EYES: Conjunctivae and EOM are normal. Pupils are equal, round, and reactive to light. No scleral icterus.  SKIN: Skin is warm and dry. No rash noted. Not diaphoretic. No erythema. No pallor. MUSCULOSKELETAL: Normal range of motion. No tenderness.  No cyanosis,  clubbing, or edema. NEUROLOGIC: Alert and oriented to person, place, and time.  PSYCHIATRIC: Normal mood and affect. Normal behavior. Normal judgment and thought content. CARDIOVASCULAR: Normal heart rate noted, regular rhythm RESPIRATORY: Clear to auscultation bilaterally. Effort and breath sounds normal, no problems with respiration noted. BREASTS: Symmetric in size. No masses, tenderness, skin changes, nipple drainage, or lymphadenopathy bilaterally. Performed in the presence of a chaperone. ABDOMEN: Soft, no distention noted.  No tenderness, rebound or guarding.  PELVIC: Deferred   Assessment and Plan:    1. Women's annual routine gynecological examination - Discussed with Dr. Rip Harbour, given weight loss, normotensive, will trial OCP for recurrent spotting on POP  RTC in 3 months for BP check and assessment of cycle. Patient may check her bp at home and send readings via MyChart.  Mammogram due Oct 2023 Routine preventative health maintenance measures emphasized. Please refer to After Visit Summary for other counseling recommendations.      Mallie Snooks, [place], MSN, CNM Certified Nurse Midwife, Product/process development scientist for [name], [place]

## 2022-06-16 ENCOUNTER — Other Ambulatory Visit: Payer: Self-pay | Admitting: Internal Medicine

## 2022-06-16 DIAGNOSIS — Z1231 Encounter for screening mammogram for malignant neoplasm of breast: Secondary | ICD-10-CM

## 2022-07-23 ENCOUNTER — Ambulatory Visit: Payer: No Typology Code available for payment source

## 2022-07-23 ENCOUNTER — Ambulatory Visit
Admission: RE | Admit: 2022-07-23 | Discharge: 2022-07-23 | Disposition: A | Discharge status: Home / Self Care | Payer: No Typology Code available for payment source | Source: Ambulatory Visit | Attending: Internal Medicine | Admitting: Internal Medicine

## 2022-07-23 DIAGNOSIS — Z1231 Encounter for screening mammogram for malignant neoplasm of breast: Secondary | ICD-10-CM

## 2022-08-20 ENCOUNTER — Other Ambulatory Visit (HOSPITAL_COMMUNITY): Payer: Self-pay

## 2022-08-20 ENCOUNTER — Other Ambulatory Visit: Payer: Self-pay | Admitting: Emergency Medicine

## 2022-08-20 DIAGNOSIS — Z01419 Encounter for gynecological examination (general) (routine) without abnormal findings: Secondary | ICD-10-CM

## 2022-08-20 MED ORDER — NORGESTIMATE-ETH ESTRADIOL 0.25-35 MG-MCG PO TABS
1.0000 | ORAL_TABLET | Freq: Every day | ORAL | 2 refills | Status: DC
  Filled 2022-08-20: qty 84, 63d supply, fill #0

## 2022-08-20 MED ORDER — NORGESTIMATE-ETH ESTRADIOL 0.25-35 MG-MCG PO TABS: 1.0000 | ORAL_TABLET | Freq: Every day | ORAL | 2 refills | Status: DC

## 2022-08-20 NOTE — Progress Notes (Signed)
New Rx for OCP with new instructions for pharmacy to skip placebo pills.

## 2022-08-26 ENCOUNTER — Ambulatory Visit: Payer: Self-pay | Admitting: Internal Medicine

## 2022-08-28 ENCOUNTER — Other Ambulatory Visit (HOSPITAL_COMMUNITY): Payer: Self-pay

## 2022-09-03 ENCOUNTER — Other Ambulatory Visit (HOSPITAL_COMMUNITY): Payer: Self-pay

## 2022-09-23 ENCOUNTER — Other Ambulatory Visit (HOSPITAL_COMMUNITY): Payer: Self-pay

## 2022-09-25 ENCOUNTER — Other Ambulatory Visit (HOSPITAL_COMMUNITY): Payer: Self-pay

## 2022-09-25 ENCOUNTER — Other Ambulatory Visit: Payer: Self-pay | Admitting: Emergency Medicine

## 2022-09-25 DIAGNOSIS — Z01419 Encounter for gynecological examination (general) (routine) without abnormal findings: Secondary | ICD-10-CM

## 2022-09-25 MED ORDER — NORGESTIMATE-ETH ESTRADIOL 0.25-35 MG-MCG PO TABS
1.0000 | ORAL_TABLET | Freq: Every day | ORAL | 2 refills | Status: DC
  Filled 2022-09-25: qty 84, 63d supply, fill #0
  Filled 2022-09-25: qty 112, 84d supply, fill #0

## 2022-09-25 NOTE — Progress Notes (Signed)
Rx resent for OCP. Patient needs a 90 day supply for skipping placebo week.

## 2022-09-26 ENCOUNTER — Other Ambulatory Visit: Payer: Self-pay | Admitting: Obstetrics

## 2022-09-26 DIAGNOSIS — Z01419 Encounter for gynecological examination (general) (routine) without abnormal findings: Secondary | ICD-10-CM

## 2022-09-26 MED ORDER — NORGESTIMATE-ETH ESTRADIOL 0.25-35 MG-MCG PO TABS: 1.0000 | ORAL_TABLET | Freq: Every day | ORAL | 2 refills | Status: DC

## 2023-02-20 IMAGING — MG MM DIGITAL SCREENING BILAT W/ TOMO AND CAD
8 series · 8 of 24 positions shown · non-contrast
Comparison: Previous exam(s).

CLINICAL DATA: Screening.

EXAM:
DIGITAL SCREENING BILATERAL MAMMOGRAM WITH TOMOSYNTHESIS AND CAD
TECHNIQUE: Bilateral screening digital craniocaudal and mediolateral oblique
mammograms were obtained. Bilateral screening digital breast
tomosynthesis was performed. The images were evaluated with
computer-aided detection.

[R MLO synth-2D]
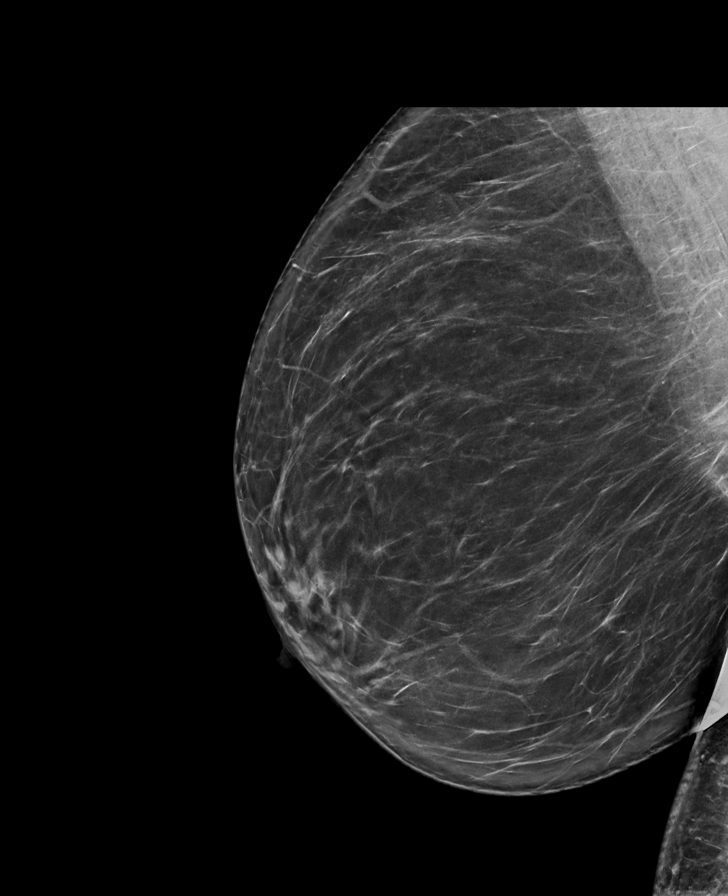

[L CC synth-2D]
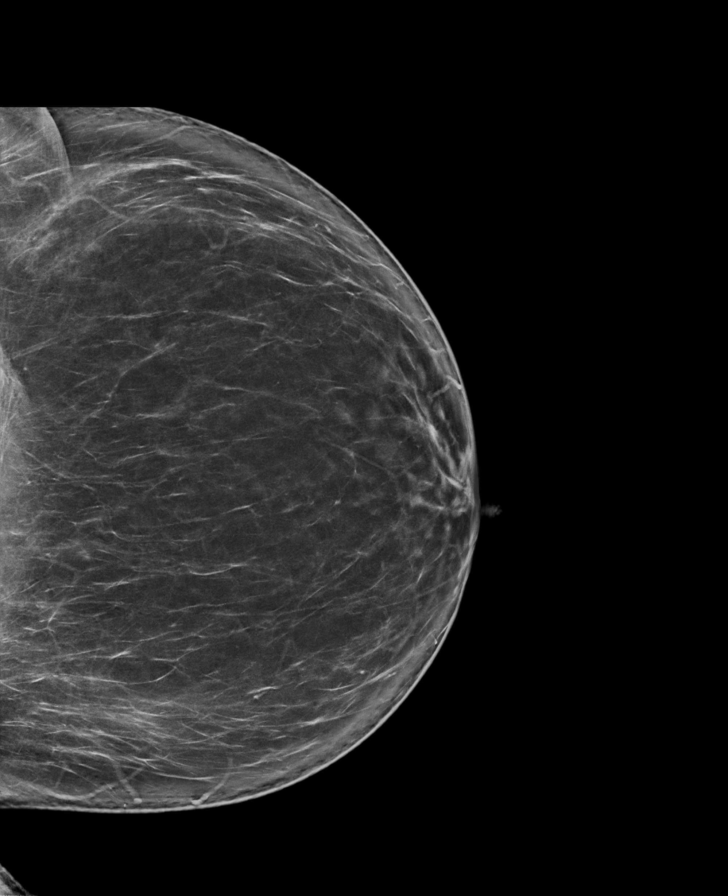

[L MLO synth-2D]
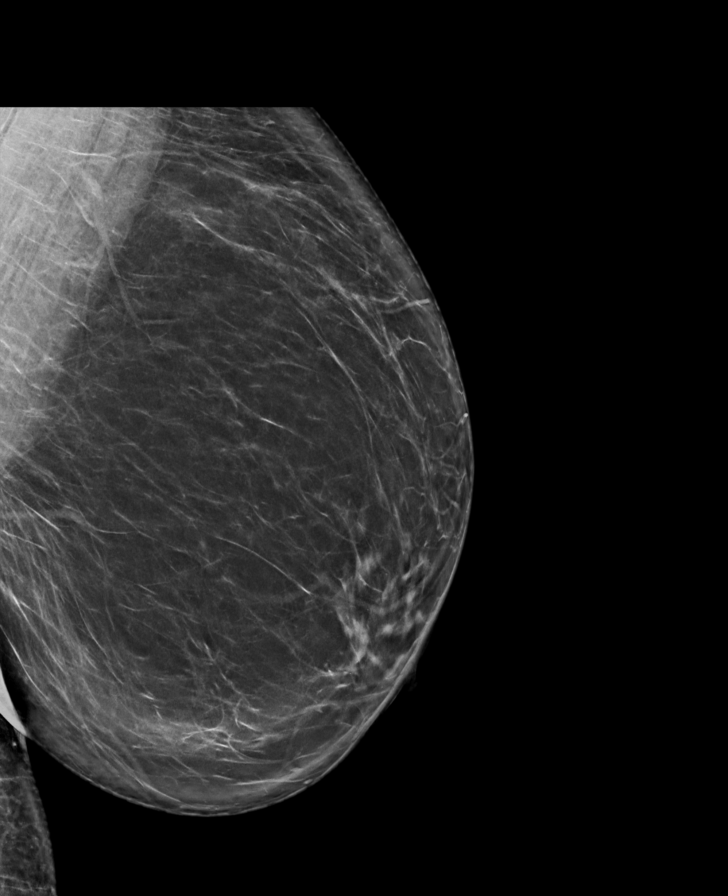

[R CC synth-2D]
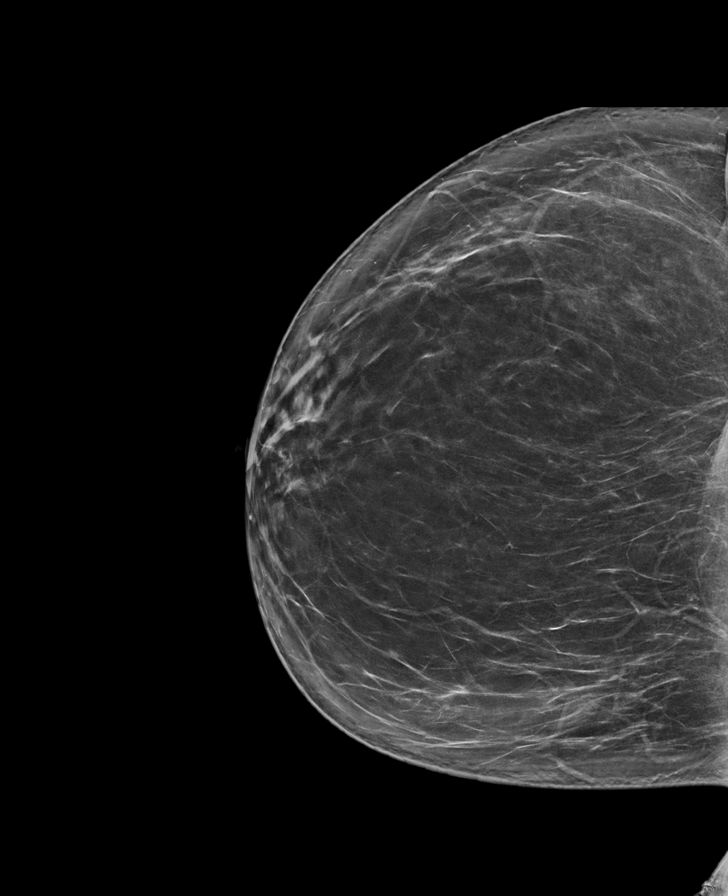

[L CC tomo · tomo slice 45/90.0]
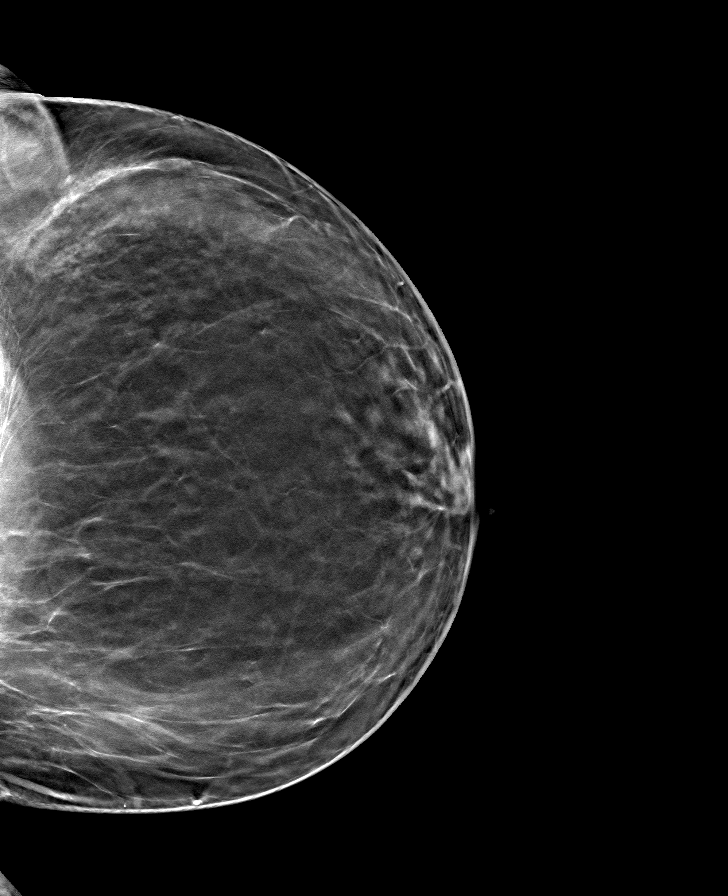

[L MLO tomo · tomo slice 46/91.0]
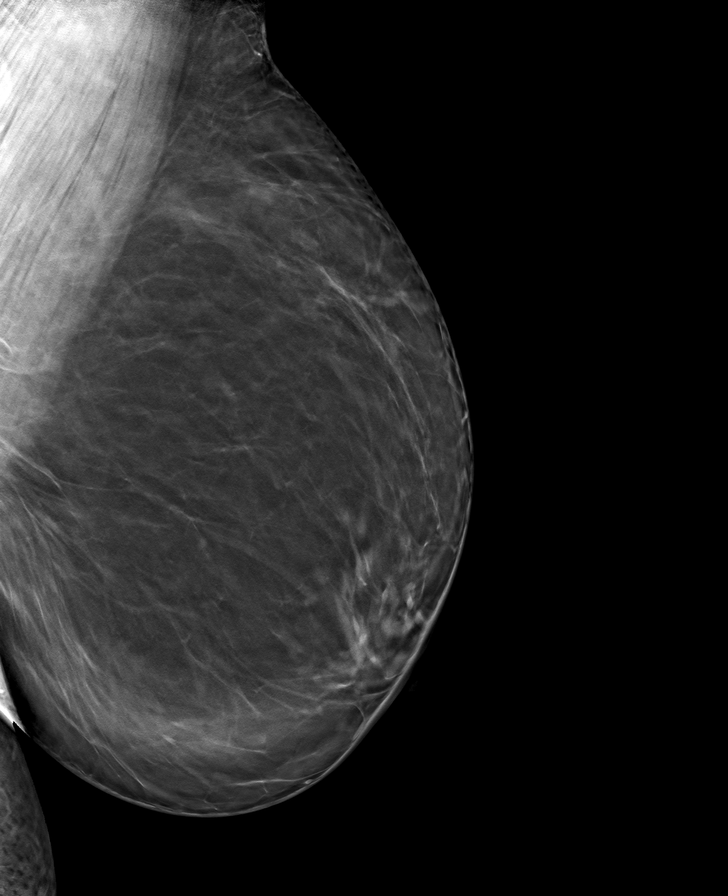

[R CC tomo · tomo slice 45/89.0]
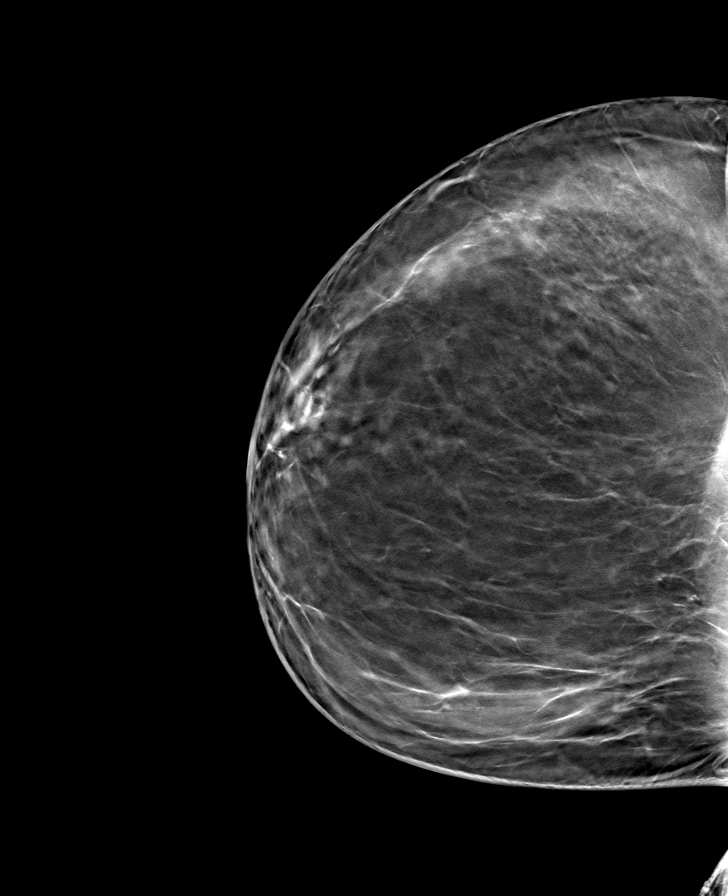

[R MLO tomo · tomo slice 45/90.0]
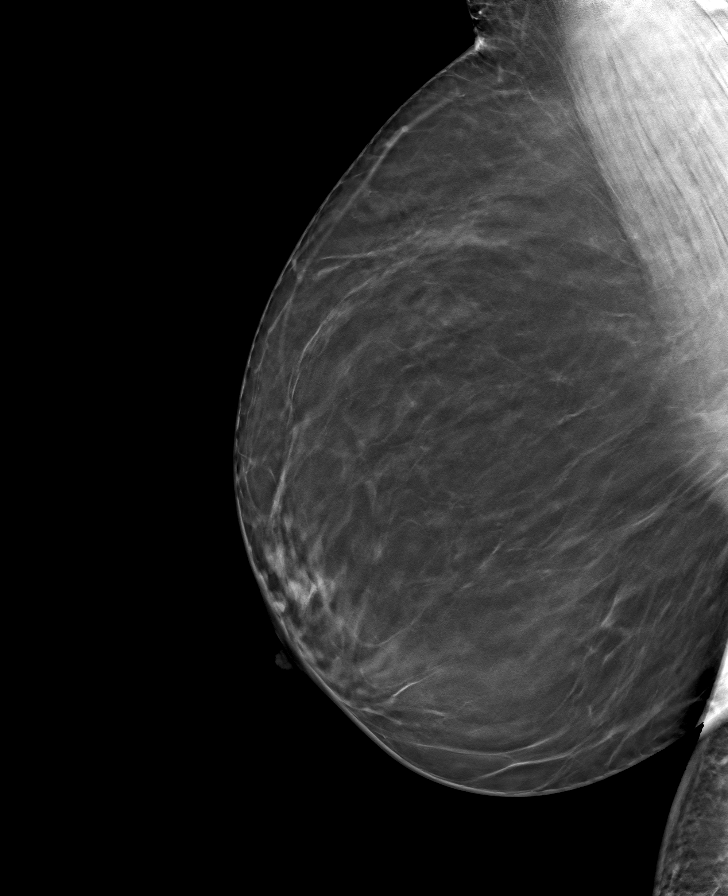

[8 of 24 positions shown; findings below may reference images not displayed]

ACR Breast Density Category b: There are scattered areas of
fibroglandular density.
FINDINGS: There are no findings suspicious for malignancy.
IMPRESSION: No mammographic evidence of malignancy. A result letter of this
screening mammogram will be mailed directly to the patient.

RECOMMENDATION:
Screening mammogram in one year. (Code:51-O-LD2)

BI-RADS CATEGORY  1: Negative.

## 2023-03-24 ENCOUNTER — Ambulatory Visit (INDEPENDENT_AMBULATORY_CARE_PROVIDER_SITE_OTHER): Payer: Self-pay | Admitting: Obstetrics & Gynecology

## 2023-03-24 ENCOUNTER — Encounter: Payer: Self-pay | Admitting: Obstetrics & Gynecology

## 2023-03-24 VITALS — BP 154/97 | HR 68 | Ht 62.0 in | Wt 164.8 lb

## 2023-03-24 DIAGNOSIS — R030 Elevated blood-pressure reading, without diagnosis of hypertension: Secondary | ICD-10-CM

## 2023-03-24 DIAGNOSIS — Z3041 Encounter for surveillance of contraceptive pills: Secondary | ICD-10-CM

## 2023-03-24 DIAGNOSIS — Z01419 Encounter for gynecological examination (general) (routine) without abnormal findings: Secondary | ICD-10-CM

## 2023-03-24 MED ORDER — LO LOESTRIN FE 1 MG-10 MCG / 10 MCG PO TABS
1.0000 | ORAL_TABLET | Freq: Every day | ORAL | 13 refills | Status: DC
Start: 2023-03-24 — End: 2023-07-21

## 2023-03-24 NOTE — Progress Notes (Signed)
GYNECOLOGY ANNUAL PREVENTATIVE CARE ENCOUNTER NOTE  History:     Frances Stewart is a 52 y.o. G0P0000 female here for a routine annual gynecologic exam.  Current complaints: no gynecologic concerns but desires pelvic exam today.   Denies abnormal vaginal bleeding, discharge, pelvic pain, problems with intercourse or other gynecologic concerns.    Gynecologic History Patient's last menstrual period was 03/02/2023. Contraception: OCP (estrogen/progesterone) Last Pap: 11/15/2020. Result was normal with negative HPV Last Mammogram: 07/23/2022.  Result was normal Last Colonoscopy: 09/15/2016.  Result was benign and polyps removed.  Obstetric History OB History  Gravida Para Term Preterm AB Living  0 0 0 0 0 0  SAB IAB Ectopic Multiple Live Births  0 0 0 0      Past Medical History:  Diagnosis Date   Allergic rhinitis    Asthma    Eczema     Past Surgical History:  Procedure Laterality Date   REFRACTIVE SURGERY  2008    Current Outpatient Medications on File Prior to Visit  Medication Sig Dispense Refill   ALBUTEROL IN Inhale into the lungs as needed.     azelastine (ASTELIN) 0.1 % nasal spray Place 1 spray into both nostrils 2 (two) times daily. Use in each nostril as directed 30 mL 11   cetirizine (ZYRTEC) 10 MG tablet Take 10 mg by mouth daily.     cholecalciferol (VITAMIN D) 1000 units tablet Take 1,000 Units by mouth daily.      cromolyn (OPTICROM) 4 % ophthalmic solution prn  3   Magnesium 100 MG CAPS Magnesium     Multiple Vitamin (MULTIVITAMIN) tablet Take 1 tablet by mouth daily.     Olopatadine HCl 0.2 % SOLN Place 1 drop into both eyes daily. 5 mL 3   triamcinolone cream (KENALOG) 0.1 % APPLY A THIN LAYER TO THE AFFECTED AREA TWICE DAILY 80 g 2   bisacodyl (DULCOLAX PINK LAXATIVE) 5 MG EC tablet Dulcolax Laxative (Patient not taking: Reported on 03/24/2023)     Blood Pressure Monitoring (BLOOD PRESSURE MONITOR/L CUFF) MISC 1 kit by Does not apply route daily. 1  each 0   hydrocortisone 2.5 % cream APPLY ON THE FACE AS DIRECTED 60 g 3   hydrOXYzine (ATARAX/VISTARIL) 10 MG tablet Take 10 mg by mouth 3 (three) times daily as needed for itching. (Patient not taking: Reported on 03/24/2023)     pimecrolimus (ELIDEL) 1 % cream Apply 1 application. topically daily as needed. (Patient not taking: Reported on 03/24/2023) 100 g 2   SM EYE ITCH RELIEF 0.025 % ophthalmic solution PLACE 1 DROP IN [facility] EYE TWICE A DAY (Patient not taking: Reported on 03/24/2023)  3   No current facility-administered medications on file prior to visit.    Allergies  Allergen Reactions   Lanolin Rash   Neomycin Rash    Social History:  reports that she has never smoked. She has never used smokeless tobacco. She reports current alcohol use of about 1.0 standard drink of alcohol per week. She reports that she does not use drugs.  Family History  Problem Relation Age of Onset   Hypertension Mother    Hypertension Father    Hyperlipidemia Father    Heart disease Father    Sarcoidosis Sister    Hypertension Sister    Breast cancer Neg Hx     The following portions of the patient's history were reviewed and updated as appropriate: allergies, current medications, past family history, past medical history,  past social history, past surgical history and problem list.  Review of Systems Pertinent items noted in HPI and remainder of comprehensive ROS otherwise negative.  Physical Exam:  BP (!) 154/97   Pulse 68   Ht 5\' 2"  (1.575 m)   Wt 164 lb 12.8 oz (74.8 kg)   LMP 03/02/2023   BMI 30.14 kg/m  Vitals:   03/24/23 0835 03/24/23 0915  BP: (!) 144/92 (!) 154/97   CONSTITUTIONAL: Well-developed, well-nourished female in no acute distress.  HENT:  Normocephalic, atraumatic, External right and left ear normal.  EYES: Conjunctivae and EOM are normal. Pupils are equal, round, and reactive to light. No scleral icterus.  NECK: Normal range of motion, supple, no masses.  Normal  thyroid.  SKIN: Skin is warm and dry. No rash noted. Not diaphoretic. No erythema. No pallor. MUSCULOSKELETAL: Normal range of motion. No tenderness.  No cyanosis, clubbing, or edema. NEUROLOGIC: Alert and oriented to person, place, and time. Normal reflexes, muscle tone coordination.  PSYCHIATRIC: Normal mood and affect. Normal behavior. Normal judgment and thought content. CARDIOVASCULAR: Normal heart rate noted, regular rhythm RESPIRATORY: Clear to auscultation bilaterally. Effort and breath sounds normal, no problems with respiration noted. BREASTS: Symmetric in size. No masses, tenderness, skin changes, nipple drainage, or lymphadenopathy bilaterally. Performed in the presence of a chaperone. ABDOMEN: Soft, no distention noted.  No tenderness, rebound or guarding.  PELVIC: Normal appearing external genitalia and urethral meatus.  No abnormal vaginal discharge noted. Enlarged fibroid uterus about 14 week size, no other palpable masses, no uterine or adnexal tenderness.  Performed in the presence of a chaperone.   Assessment and Plan:     1. Elevated blood pressure readings No diagnosis of HTN.  Hesitant to refill Ortho Cyclen, discussed concerns about risks of estrogen and worsening HTN, heart disease, VTE, stroke etc.  Recommended low estrogen pill or no estrogen pill. She will come back for repeat BP checks and follow up with PCP.   2. Oral contraceptive pill surveillance Lo Loestrin samples given in lieu of Ortho Cyclen. Patient is also considering on cessation of hormonal therapy, will see how she does with any decision she makes.  - LO LOESTRIN FE 1 MG-10 MCG / 10 MCG tablet; Take 1 tablet by mouth daily.  Dispense: 3 tablet; Refill: 13  3. Women's annual routine gynecological examination Up to date on pap smear and mammogram. Colon cancer screening may be up to date, but will follow up with GI as polyps were removed and she may need earlier follow up. Routine preventative health  maintenance measures emphasized. Please refer to After Visit Summary for other counseling recommendations.      Jaynie Collins, MD, FACOG Obstetrician & Gynecologist, [facility] for [employer], [facility]

## 2023-05-11 LAB — TSH: TSH: 2.05 (ref 0.41–5.90)

## 2023-05-11 LAB — LIPID PANEL
Cholesterol: 227 — AB (ref 0–200)
HDL: 60 (ref 35–70)
LDL Cholesterol: 148
LDl/HDL Ratio: 3.8
Triglycerides: 84 (ref 40–160)

## 2023-05-11 LAB — COMPREHENSIVE METABOLIC PANEL
Albumin: 4.2 (ref 3.5–5.0)
Calcium: 8.9 (ref 8.7–10.7)
Globulin: 2.7
eGFR: 64

## 2023-05-11 LAB — IRON,TIBC AND FERRITIN PANEL
Ferritin: 8
TIBC: 411

## 2023-05-11 LAB — VITAMIN D 25 HYDROXY (VIT D DEFICIENCY, FRACTURES): Vit D, 25-Hydroxy: 23

## 2023-05-11 LAB — HEPATIC FUNCTION PANEL
ALT: 11 U/L (ref 7–35)
AST: 20 (ref 13–35)
Alkaline Phosphatase: 68 (ref 25–125)
Bilirubin, Direct: 0.1
Bilirubin, Total: 0.4

## 2023-05-11 LAB — CBC AND DIFFERENTIAL
HCT: 37 (ref 36–46)
Hemoglobin: 11.8 — AB (ref 12.0–16.0)
Platelets: 328 10*3/uL (ref 150–400)
WBC: 4.2

## 2023-05-11 LAB — CBC: RBC: 3.99 (ref 3.87–5.11)

## 2023-05-11 LAB — HEMOGLOBIN A1C: Hemoglobin A1C: 5.4

## 2023-05-11 LAB — BASIC METABOLIC PANEL
Creatinine: 1 (ref 0.5–1.1)
Glucose: 81

## 2023-07-20 ENCOUNTER — Other Ambulatory Visit: Payer: Self-pay

## 2023-07-20 ENCOUNTER — Encounter: Payer: Self-pay | Admitting: Internal Medicine

## 2023-07-21 ENCOUNTER — Ambulatory Visit (INDEPENDENT_AMBULATORY_CARE_PROVIDER_SITE_OTHER): Payer: Self-pay | Admitting: Internal Medicine

## 2023-07-21 ENCOUNTER — Other Ambulatory Visit: Payer: Self-pay

## 2023-07-21 ENCOUNTER — Encounter: Payer: Self-pay | Admitting: Obstetrics & Gynecology

## 2023-07-21 VITALS — BP 122/78 | HR 72 | Temp 98.6°F | Ht 62.0 in | Wt 159.0 lb

## 2023-07-21 DIAGNOSIS — Z1211 Encounter for screening for malignant neoplasm of colon: Secondary | ICD-10-CM

## 2023-07-21 DIAGNOSIS — E663 Overweight: Secondary | ICD-10-CM

## 2023-07-21 DIAGNOSIS — L309 Dermatitis, unspecified: Secondary | ICD-10-CM

## 2023-07-21 DIAGNOSIS — Z6829 Body mass index (BMI) 29.0-29.9, adult: Secondary | ICD-10-CM

## 2023-07-21 DIAGNOSIS — R030 Elevated blood-pressure reading, without diagnosis of hypertension: Secondary | ICD-10-CM

## 2023-07-21 DIAGNOSIS — D649 Anemia, unspecified: Secondary | ICD-10-CM

## 2023-07-21 DIAGNOSIS — Z3041 Encounter for surveillance of contraceptive pills: Secondary | ICD-10-CM

## 2023-07-21 MED ORDER — HYDROCORTISONE 2.5 % EX CREA
TOPICAL_CREAM | CUTANEOUS | 3 refills | Status: DC
Start: 2023-07-21 — End: 2023-08-31

## 2023-07-21 MED ORDER — TRIAMCINOLONE ACETONIDE 0.1 % EX CREA: TOPICAL_CREAM | CUTANEOUS | 2 refills | Status: DC

## 2023-07-21 MED ORDER — ALBUTEROL SULFATE HFA 108 (90 BASE) MCG/ACT IN AERS: 1.0000 | INHALATION_SPRAY | Freq: Four times a day (QID) | RESPIRATORY_TRACT | 1 refills | Status: DC | PRN

## 2023-07-21 MED ORDER — LO LOESTRIN FE 1 MG-10 MCG / 10 MCG PO TABS
1.0000 | ORAL_TABLET | Freq: Every day | ORAL | Status: DC
Start: 2023-07-21 — End: 2023-07-22

## 2023-07-21 NOTE — Progress Notes (Signed)
I,Jameka J Llittleton, CMA,acting as a Neurosurgeon for Gwynneth Aliment, MD.,have documented all relevant documentation on the behalf of Gwynneth Aliment, MD,as directed by  Gwynneth Aliment, MD while in the presence of Gwynneth Aliment, MD.  Subjective:  Patient ID: Frances Stewart , female    DOB: [birth year] , 52 y.o.   MRN: [id number]  Chief Complaint  Patient presents with   Hypertension    HPI  She is here today for further evaluation of elevated BP. She states her blood pressure was 150s/90s at GYN visit. She has no prior history of high blood pressure. At this time, she feels her readings have improved. She has had biometric screening at her church. She denies having headaches, chest pain and shortness of breath.  She has no specific complaints at this time.   Patient would like her prescriptions printed out.      Past Medical History:  Diagnosis Date   Allergic rhinitis    Asthma    Eczema      Family History  Problem Relation Age of Onset   Hypertension Mother    Hypertension Father    Hyperlipidemia Father    Heart disease Father    Sarcoidosis Sister    Hypertension Sister    Breast cancer Neg Hx      Current Outpatient Medications:    azelastine (ASTELIN) 0.1 % nasal spray, Place 1 spray into both nostrils 2 (two) times daily. Use in each nostril as directed, Disp: 30 mL, Rfl: 11   bisacodyl (DULCOLAX PINK LAXATIVE) 5 MG EC tablet, 100 mg daily as needed., Disp: , Rfl:    Blood Pressure Monitoring (BLOOD PRESSURE MONITOR/L CUFF) MISC, 1 kit by Does not apply route daily., Disp: 1 each, Rfl: 0   cetirizine (ZYRTEC) 10 MG tablet, Take 10 mg by mouth daily., Disp: , Rfl:    cholecalciferol (VITAMIN D) 1000 units tablet, Take 1,000 Units by mouth daily. , Disp: , Rfl:    cromolyn (OPTICROM) 4 % ophthalmic solution, prn, Disp: , Rfl: 3   Iron, Ferrous Sulfate, 325 (65 Fe) MG TABS, , Disp: , Rfl:    MAGNESIUM GLYCINATE PO, Take 240 capsules by mouth., Disp: , Rfl:    Multiple  Vitamin (MULTIVITAMIN) tablet, Take 1 tablet by mouth daily., Disp: , Rfl:    Olopatadine HCl 0.2 % SOLN, Place 1 drop into both eyes daily., Disp: 5 mL, Rfl: 3   SM EYE ITCH RELIEF 0.025 % ophthalmic solution, , Disp: , Rfl: 3   albuterol (VENTOLIN HFA) 108 (90 Base) MCG/ACT inhaler, Inhale 1-2 puffs into the lungs every 6 (six) hours as needed for wheezing or shortness of breath., Disp: 18 g, Rfl: 1   hydrocortisone 2.5 % cream, APPLY ON THE FACE AS DIRECTED, Disp: 60 g, Rfl: 3   LO LOESTRIN FE 1 MG-10 MCG / 10 MCG tablet, Take 1 tablet by mouth daily. Please take only active hormonal pills in a continuous fashion.  Do not take nonhormonal/placebo pills., Disp: 112 tablet, Rfl: 4   triamcinolone cream (KENALOG) 0.1 %, APPLY A THIN LAYER TO THE AFFECTED AREA TWICE DAILY, Disp: 80 g, Rfl: 2   Allergies  Allergen Reactions   Lanolin Rash   Neomycin Rash     Review of Systems  Constitutional: Negative.   HENT: Negative.    Eyes: Negative.   Respiratory: Negative.    Cardiovascular: Negative.   Gastrointestinal: Negative.   Musculoskeletal: Negative.   Skin: Negative.  Neurological: Negative.   Psychiatric/Behavioral: Negative.       Today's Vitals   07/21/23 1021  BP: 122/78  Pulse: 72  Temp: 98.6 F (37 C)  Weight: 159 lb (72.1 kg)  Height: 5\' 2"  (1.575 m)  PainSc: 0-No pain   Body mass index is 29.08 kg/m.  Wt Readings from Last 3 Encounters:  07/21/23 159 lb (72.1 kg)  03/24/23 164 lb 12.8 oz (74.8 kg)  03/06/22 165 lb 12.8 oz (75.2 kg)    BP Readings from Last 3 Encounters:  07/21/23 122/78  03/24/23 (!) 154/97  03/06/22 129/87     The 10-year ASCVD risk score (Arnett DK, et al., 2019) is: 1.7%*   Values used to calculate the score:     Age: 6 years     Sex: Female     Is Non-Hispanic African American: No     Diabetic: No     Tobacco smoker: No     Systolic Blood Pressure: 122 mmHg     Is BP treated: No     HDL Cholesterol: 60 mg/dL*     Total  Cholesterol: 227 mg/dL*     * - Cholesterol units were assumed for this score calculation  Objective:  Physical Exam Vitals and nursing note reviewed.  Constitutional:      Appearance: Normal appearance.  HENT:     Head: Normocephalic and atraumatic.  Eyes:     Extraocular Movements: Extraocular movements intact.  Cardiovascular:     Rate and Rhythm: Normal rate and regular rhythm.     Heart sounds: Normal heart sounds.  Pulmonary:     Effort: Pulmonary effort is normal.     Breath sounds: Normal breath sounds.  Musculoskeletal:     Cervical back: Normal range of motion.  Skin:    General: Skin is warm.  Neurological:     General: No focal deficit present.     Mental Status: She is alert.  Psychiatric:        Mood and Affect: Mood normal.        Behavior: Behavior normal.         Assessment And Plan:  Elevated blood pressure reading Assessment & Plan: Her readings have improved since June 2024. Encouraged to follow DASH diet - free of processed meats and foods. No need to start meds at this time. She will continue to monitor her blood pressure and will alert me if she needs further evaluation. Lastly, recent labs drawn during biometric screening were reviewed in detail.    Borderline anemia Assessment & Plan: She is advised to take iron during menses and week of blood donations    Eczema, unspecified type -     Hydrocortisone; APPLY ON THE FACE AS DIRECTED  Dispense: 60 g; Refill: 3  Overweight with body mass index (BMI) of 29 to 29.9 in adult Assessment & Plan: She was congratulated on her 5lb weight loss since her last visit. Encouraged to continue with her heart healthy lifestyle.    Special screening for malignant neoplasm of colon Assessment & Plan: Currently, she is without insurance. Cologuard is a more effective option for CRC screening. I will place order, she is aware [facility] will contact her regarding testing and will be able to advise her of the  cost. She will make decision if she pursues this option.   Orders: -     Cologuard    Return in about 6 months (around 01/19/2024), or physical.  Patient was given opportunity to  ask questions. Patient verbalized understanding of the plan and was able to repeat key elements of the plan. All questions were answered to their satisfaction.    I, Gwynneth Aliment, MD, have reviewed all documentation for this visit. The documentation on 07/21/23 for the exam, diagnosis, procedures, and orders are all accurate and complete.  IF YOU HAVE BEEN REFERRED TO A SPECIALIST, IT MAY TAKE 1-2 WEEKS TO SCHEDULE/PROCESS THE REFERRAL. IF YOU HAVE NOT HEARD FROM US/SPECIALIST IN TWO WEEKS, PLEASE GIVE [place] A CALL AT [phone].

## 2023-07-21 NOTE — Patient Instructions (Signed)
Coronary Calcium Scan A coronary calcium scan is an imaging test used to look for deposits of plaque in the inner lining of the blood vessels of the heart (coronary arteries). Plaque is made up of calcium, protein, and fatty substances. These deposits of plaque can partly clog and narrow the coronary arteries without producing any symptoms or warning signs. This puts a person at risk for a heart attack. A coronary calcium scan is performed using a computed tomography (CT) scanner machine without using a dye (contrast). This test is recommended for people who are at moderate risk for heart disease. The test can find plaque deposits before symptoms develop. Tell a health care provider about: Any allergies you have. All medicines you are taking, including vitamins, herbs, eye drops, creams, and over-the-counter medicines. Any problems you or family members have had with anesthetic medicines. Any bleeding problems you have. Any surgeries you have had. Any medical conditions you have. Whether you are pregnant or may be pregnant. What are the risks? Generally, this is a safe procedure. However, problems may occur, including: Harm to a pregnant woman and her unborn baby. This test involves the use of radiation. Radiation exposure can be dangerous to a pregnant woman and her unborn baby. If you are pregnant or think you may be pregnant, you should not have this procedure done. A slight increase in the risk of cancer. This is because of the radiation involved in the test. The amount of radiation from one test is similar to the amount of radiation you are naturally exposed to over one year. What happens before the procedure? Ask your health care provider for any specific instructions on how to prepare for this procedure. You may be asked to avoid products that contain caffeine, tobacco, or nicotine for 4 hours before the procedure. What happens during the procedure?  You will undress and remove any jewelry  from your neck or chest. You may need to remove hearing aides and dentures. Women may need to remove their bras. You will put on a hospital gown. Sticky electrodes will be placed on your chest. The electrodes will be connected to an electrocardiogram (ECG) machine to record a tracing of the electrical activity of your heart. You will lie down on your back on a curved bed that is attached to the CT scanner. You may be given medicine to slow down your heart rate so that clear pictures can be created. You will be moved into the CT scanner, and the CT scanner will take pictures of your heart. During this time, you will be asked to lie still and hold your breath for 10-20 seconds at a time while each picture of your heart is being taken. The procedure may vary among health care providers and hospitals. What can I expect after the procedure? You can return to your normal activities. It is up to you to get the results of your procedure. Ask your health care provider, or the department that is doing the procedure, when your results will be ready. Summary A coronary calcium scan is an imaging test used to look for deposits of plaque in the inner lining of the blood vessels of the heart. Plaque is made up of calcium, protein, and fatty substances. A coronary calcium scan is performed using a CT scanner machine without contrast. Generally, this is a safe procedure. Tell your health care provider if you are pregnant or may be pregnant. Ask your health care provider for any specific instructions on how to  prepare for this procedure. You can return to your normal activities after the scan is done. This information is not intended to replace advice given to you by your health care provider. Make sure you discuss any questions you have with your health care provider. Document Revised: 08/25/2021 Document Reviewed: 08/25/2021 Elsevier Patient Education  2024 [employer].

## 2023-07-22 ENCOUNTER — Encounter: Payer: Self-pay | Admitting: Internal Medicine

## 2023-07-22 ENCOUNTER — Other Ambulatory Visit: Payer: Self-pay | Admitting: Obstetrics & Gynecology

## 2023-07-22 DIAGNOSIS — Z3041 Encounter for surveillance of contraceptive pills: Secondary | ICD-10-CM

## 2023-07-22 MED ORDER — LO LOESTRIN FE 1 MG-10 MCG / 10 MCG PO TABS: 1.0000 | ORAL_TABLET | Freq: Every day | ORAL | 4 refills | Status: DC

## 2023-07-22 NOTE — Progress Notes (Signed)
Printed prescription for refill of Lo Loestrin for patient as desired.   Jaynie Collins, MD

## 2023-07-28 DIAGNOSIS — R030 Elevated blood-pressure reading, without diagnosis of hypertension: Secondary | ICD-10-CM | POA: Insufficient documentation

## 2023-07-28 DIAGNOSIS — L309 Dermatitis, unspecified: Secondary | ICD-10-CM | POA: Insufficient documentation

## 2023-07-28 DIAGNOSIS — Z1211 Encounter for screening for malignant neoplasm of colon: Secondary | ICD-10-CM | POA: Insufficient documentation

## 2023-07-28 DIAGNOSIS — Z6829 Body mass index (BMI) 29.0-29.9, adult: Secondary | ICD-10-CM | POA: Insufficient documentation

## 2023-07-28 DIAGNOSIS — D649 Anemia, unspecified: Secondary | ICD-10-CM | POA: Insufficient documentation

## 2023-07-28 NOTE — Assessment & Plan Note (Addendum)
Her readings have improved since June 2024. Encouraged to follow DASH diet - free of processed meats and foods. No need to start meds at this time. She will continue to monitor her blood pressure and will alert me if she needs further evaluation. Lastly, recent labs drawn during biometric screening were reviewed in detail.

## 2023-07-28 NOTE — Assessment & Plan Note (Signed)
Currently, she is without insurance. Cologuard is a more effective option for CRC screening. I will place order, she is aware [facility] will contact her regarding testing and will be able to advise her of the cost. She will make decision if she pursues this option.

## 2023-07-28 NOTE — Assessment & Plan Note (Signed)
She is advised to take iron during menses and week of blood donations

## 2023-07-28 NOTE — Assessment & Plan Note (Signed)
She was congratulated on her 5lb weight loss since her last visit. Encouraged to continue with her heart healthy lifestyle.

## 2023-08-10 ENCOUNTER — Other Ambulatory Visit: Payer: Self-pay | Admitting: Internal Medicine

## 2023-08-10 DIAGNOSIS — Z1231 Encounter for screening mammogram for malignant neoplasm of breast: Secondary | ICD-10-CM

## 2023-08-31 ENCOUNTER — Other Ambulatory Visit: Payer: Self-pay

## 2023-08-31 DIAGNOSIS — L309 Dermatitis, unspecified: Secondary | ICD-10-CM

## 2023-08-31 DIAGNOSIS — Z3041 Encounter for surveillance of contraceptive pills: Secondary | ICD-10-CM

## 2023-08-31 MED ORDER — ALBUTEROL SULFATE HFA 108 (90 BASE) MCG/ACT IN AERS: 1.0000 | INHALATION_SPRAY | Freq: Four times a day (QID) | RESPIRATORY_TRACT | 1 refills | Status: AC | PRN

## 2023-08-31 MED ORDER — HYDROCORTISONE 2.5 % EX CREA: TOPICAL_CREAM | CUTANEOUS | 3 refills | Status: AC

## 2023-08-31 MED ORDER — TRIAMCINOLONE ACETONIDE 0.1 % EX CREA: TOPICAL_CREAM | CUTANEOUS | 2 refills | Status: DC

## 2023-08-31 MED ORDER — LO LOESTRIN FE 1 MG-10 MCG / 10 MCG PO TABS: 1.0000 | ORAL_TABLET | Freq: Every day | ORAL | 4 refills | Status: AC

## 2023-09-24 ENCOUNTER — Ambulatory Visit
Admission: RE | Admit: 2023-09-24 | Discharge: 2023-09-24 | Disposition: A | Discharge status: Home / Self Care | Payer: No Typology Code available for payment source | Source: Ambulatory Visit | Attending: Internal Medicine | Admitting: Internal Medicine

## 2023-09-24 DIAGNOSIS — Z1231 Encounter for screening mammogram for malignant neoplasm of breast: Secondary | ICD-10-CM

## 2023-11-06 ENCOUNTER — Other Ambulatory Visit (HOSPITAL_COMMUNITY): Payer: Self-pay

## 2023-11-16 ENCOUNTER — Other Ambulatory Visit: Payer: Self-pay | Admitting: Internal Medicine

## 2023-11-16 ENCOUNTER — Encounter: Payer: Self-pay | Admitting: Internal Medicine

## 2023-11-16 DIAGNOSIS — D179 Benign lipomatous neoplasm, unspecified: Secondary | ICD-10-CM

## 2023-12-17 ENCOUNTER — Encounter: Payer: Self-pay | Admitting: Gastroenterology

## 2023-12-27 LAB — CBC AND DIFFERENTIAL
HCT: 43 (ref 36–46)
Hemoglobin: 13.9 (ref 12.0–16.0)
Platelets: 405 K/uL — AB (ref 150–400)
WBC: 3.6

## 2023-12-27 LAB — HEPATIC FUNCTION PANEL
ALT: 11 U/L (ref 7–35)
AST: 20 (ref 13–35)
Alkaline Phosphatase: 59 (ref 25–125)
Bilirubin, Direct: 0.1 (ref 0.01–0.4)
Bilirubin, Total: 0.5

## 2023-12-27 LAB — BASIC METABOLIC PANEL WITH GFR
Creatinine: 1.2 — AB (ref 0.5–1.1)
Glucose: 88

## 2023-12-27 LAB — LIPID PANEL
Cholesterol: 281 — AB (ref 0–200)
HDL: 9 — AB (ref 35–70)
LDL Cholesterol: 204
LDl/HDL Ratio: 4.8
Triglycerides: 76 (ref 40–160)

## 2023-12-27 LAB — COMPREHENSIVE METABOLIC PANEL WITH GFR
Albumin: 4.6 (ref 3.5–5.0)
Calcium: 9.4 (ref 8.7–10.7)
Globulin: 2.6
eGFR: 57

## 2023-12-27 LAB — TSH: TSH: 1.58 (ref 0.41–5.90)

## 2023-12-27 LAB — HEMOGLOBIN A1C: Hemoglobin A1C: 5.4

## 2023-12-27 LAB — IRON,TIBC AND FERRITIN PANEL
Ferritin: 37
Iron: 172
TIBC: 400

## 2023-12-27 LAB — VITAMIN D 25 HYDROXY (VIT D DEFICIENCY, FRACTURES): Vit D, 25-Hydroxy: 30

## 2024-02-08 ENCOUNTER — Telehealth: Payer: Self-pay

## 2024-02-08 NOTE — Telephone Encounter (Signed)
 Phoned patient and spoke about giving her samples of the lo loestrin . Patient stated she was fairly close and would be stopping by the office to pick them up shortly.

## 2024-04-04 ENCOUNTER — Ambulatory Visit (INDEPENDENT_AMBULATORY_CARE_PROVIDER_SITE_OTHER): Payer: Self-pay | Admitting: Internal Medicine

## 2024-04-04 VITALS — BP 130/84 | HR 76 | Temp 98.1°F | Ht 62.0 in | Wt 167.6 lb

## 2024-04-04 DIAGNOSIS — I10 Essential (primary) hypertension: Secondary | ICD-10-CM

## 2024-04-04 DIAGNOSIS — R7982 Elevated C-reactive protein (CRP): Secondary | ICD-10-CM

## 2024-04-04 DIAGNOSIS — N289 Disorder of kidney and ureter, unspecified: Secondary | ICD-10-CM

## 2024-04-04 MED ORDER — AIRSUPRA 90-80 MCG/ACT IN AERO: 2.0000 | INHALATION_SPRAY | RESPIRATORY_TRACT | 3 refills | Status: DC | PRN

## 2024-04-04 MED ORDER — TRIAMCINOLONE ACETONIDE 0.1 % EX CREA: TOPICAL_CREAM | CUTANEOUS | 2 refills | Status: AC

## 2024-04-04 NOTE — Patient Instructions (Addendum)
 [employer]   How to Take Your Blood Pressure Blood pressure measures how strongly your blood is pressing against the walls of your arteries. Arteries are blood vessels that carry blood from your heart throughout your body. You can take your blood pressure at home with a machine. You may need to check your blood pressure at home: To check if you have high blood pressure (hypertension). To check your blood pressure over time. To make sure your blood pressure medicine is working. Supplies needed: Blood pressure machine, or monitor. A chair to sit in. This should be a chair where you can sit upright with your back supported. Do not sit on a soft couch or an armchair. Table or desk. Small notebook. Pencil or pen. How to prepare Avoid these things for 30 minutes before checking your blood pressure: Having drinks with caffeine in them, such as coffee or tea. Drinking alcohol. Eating. Smoking. Exercising. Do these things five minutes before checking your blood pressure: Go to the bathroom and pee (urinate). Sit in a chair. Be quiet. Do not talk. How to take your blood pressure Follow the instructions that came with your machine. If you have a digital blood pressure monitor, these may be the instructions: Sit up straight. Place your feet on the floor. Do not cross your ankles or legs. Rest your left arm at the level of your heart. You may rest it on a table, desk, or chair. Pull up your shirt sleeve. Wrap the blood pressure cuff around the upper part of your left arm. The cuff should be 1 inch (2.5 cm) above your elbow. It is best to wrap the cuff around bare skin. Fit the cuff snugly around your arm, but not too tightly. You should be able to place only one finger between the cuff and your arm. Place the cord so that it rests in the bend of your elbow. Press the power button. Sit quietly while the cuff fills with air and loses air. Write down the numbers on the  screen. Wait 2-3 minutes and then repeat steps 1-10. What do the numbers mean? Two numbers make up your blood pressure. The first number is called systolic pressure. The second is called diastolic pressure. An example of a blood pressure reading is 120 over 80 (or 120/80). If you are an adult and do not have a medical condition, use this guide to find out if your blood pressure is normal: Normal First number: below 120. Second number: below 80. Elevated First number: 120-129. Second number: below 80. Hypertension stage 1 First number: 130-139. Second number: 80-89. Hypertension stage 2 First number: 140 or above. Second number: 90 or above. Your blood pressure is above normal even if only the first or only the second number is above normal. Follow these instructions at home: Medicines Take over-the-counter and prescription medicines only as told by your doctor. Tell your doctor if your medicine is causing side effects. General instructions Check your blood pressure as often as your doctor tells you to. Check your blood pressure at the same time every day. Take your monitor to your next doctor's appointment. Your doctor will: Make sure you are using it correctly. Make sure it is working right. Understand what your blood pressure numbers should be. Keep all follow-up visits. General tips You will need a blood pressure machine or monitor. Your doctor can suggest a monitor. You can buy one at a drugstore or online. When choosing one: Choose one with an arm cuff. Choose  one that wraps around your upper arm. Only one finger should fit between your arm and the cuff. Do not choose one that measures your blood pressure from your wrist or finger. Where to find more information American Heart Association: www.heart.org Contact a doctor if: Your blood pressure keeps being high. Your blood pressure is suddenly low. Get help right away if: Your first blood pressure number is higher than  180. Your second blood pressure number is higher than 120. These symptoms may be an emergency. Do not wait to see if the symptoms will go away. Get help right away. Call 911. Summary Check your blood pressure at the same time every day. Avoid caffeine, alcohol, smoking, and exercise for 30 minutes before checking your blood pressure. Make sure you understand what your blood pressure numbers should be. This information is not intended to replace advice given to you by your health care provider. Make sure you discuss any questions you have with your health care provider. Document Revised: 05/30/2021 Document Reviewed: 05/30/2021 Elsevier Patient Education  2024 [employer].

## 2024-04-04 NOTE — Progress Notes (Signed)
 I,[name], CMA,acting as a Neurosurgeon for [name], MD.,have documented all relevant documentation on the behalf of [name], MD,as directed by  [name], MD while in the presence of [name], MD.  Subjective:  Patient ID: Frances Stewart , female    DOB: [birth year] , 53 y.o.   MRN: [id number]  Chief Complaint  Patient presents with   [facility]    Patient presents today for elevated blood pressure recheck. She does not currently take any prescribed medications for bp. Denies headache, chest pain & sob. She does keep track of her blood pressure at home.  She also completed a panel of labs on 11/30/23 she would like to discuss today. She saw some things were elevated & would like those labs rechecked.    HPI Discussed the use of AI scribe software for clinical note transcription with the patient, who gave verbal consent to proceed.  History of Present Illness Frances Stewart is a 53 year old female with hypertension who presents for follow-up of elevated blood pressure.  She experiences elevated blood pressure readings during medical visits, but her home readings are normal. Her blood pressure was elevated during a recent colonoscopy screening, which she attributes to being 'annoyed' with the process. She questions whether she has white coat hypertension, as her blood pressure is not elevated at home.  There is a family history of hypertension, and she is concerned about the impact of elevated blood pressure on her kidneys. Her creatinine level was 1.1 during her last blood work, prompting her to have it rechecked. She monitors her blood pressure at home using an appropriately sized cuff and reports normal readings unless she is dehydrated or stressed.  She has been drinking more water recently, especially in the last two weeks, and acknowledges that she could improve her hydration habits. She drinks water before leaving the house and throughout the day, although she  admits to being 'really bad' at drinking water when in the clinic.  There is a family history of rheumatoid arthritis and sarcoidosis. She notes occasional right hip pain, which she attributes to possible genetic factors, as her father had both hips replaced. Swimming alleviates her hip pain, but activities like running or biking exacerbate it.  She participated in a 5K event recently and managed her hydration well, resulting in no headaches, which she previously experienced when dehydrated. No current headaches, controlled allergies, and occasional right hip pain.   Past Medical History:  Diagnosis Date   Allergic rhinitis    Asthma    Eczema      Family History  Problem Relation Age of Onset   Hypertension Mother    Hypertension Father    Hyperlipidemia Father    Heart disease Father    Sarcoidosis Sister    Hypertension Sister    Breast cancer Neg Hx      Current Outpatient Medications:    albuterol  (VENTOLIN  HFA) 108 (90 Base) MCG/ACT inhaler, Inhale 1-2 puffs into the lungs every 6 (six) hours as needed for wheezing or shortness of breath., Disp: 18 g, Rfl: 1   Albuterol -Budesonide (AIRSUPRA ) 90-80 MCG/ACT AERO, Inhale 2 puffs into the lungs every 4 (four) hours as needed., Disp: 10.7 g, Rfl: 3   azelastine  (ASTELIN ) 0.1 % nasal spray, Place 1 spray into both nostrils 2 (two) times daily. Use in each nostril as directed, Disp: 30 mL, Rfl: 11   Blood Pressure Monitoring (BLOOD PRESSURE MONITOR/L CUFF) MISC, 1 kit  by Does not apply route daily., Disp: 1 each, Rfl: 0   cetirizine (ZYRTEC) 10 MG tablet, Take 10 mg by mouth daily., Disp: , Rfl:    cholecalciferol (VITAMIN D ) 1000 units tablet, Take 1,000 Units by mouth daily. , Disp: , Rfl:    cromolyn (OPTICROM) 4 % ophthalmic solution, prn, Disp: , Rfl: 3   hydrocortisone  2.5 % cream, APPLY ON THE FACE AS DIRECTED, Disp: 60 g, Rfl: 3   Iron, Ferrous Sulfate, 325 (65 Fe) MG TABS, , Disp: , Rfl:    LO LOESTRIN FE  1 MG-10 MCG / 10  MCG tablet, Take 1 tablet by mouth daily. Please take only active hormonal pills in a continuous fashion.  Do not take nonhormonal/placebo pills., Disp: 112 tablet, Rfl: 4   MAGNESIUM GLYCINATE PO, Take 240 capsules by mouth., Disp: , Rfl:    Multiple Vitamin (MULTIVITAMIN) tablet, Take 1 tablet by mouth daily., Disp: , Rfl:    Olopatadine  HCl 0.2 % SOLN, Place 1 drop into both eyes daily., Disp: 5 mL, Rfl: 3   SM EYE ITCH RELIEF 0.025 % ophthalmic solution, , Disp: , Rfl: 3   triamcinolone  cream (KENALOG ) 0.1 %, APPLY A THIN LAYER TO THE AFFECTED AREA TWICE DAILY, Disp: 80 g, Rfl: 2   Allergies  Allergen Reactions   Lanolin Rash   Neomycin Rash     Review of Systems  Constitutional: Negative.   Respiratory: Negative.    Cardiovascular: Negative.   Gastrointestinal: Negative.   Musculoskeletal:  Positive for arthralgias.  Neurological: Negative.   Psychiatric/Behavioral: Negative.       Today's Vitals   04/04/24 1408  BP: 130/84  Pulse: 76  Temp: 98.1 F (36.7 C)  SpO2: 98%  Weight: 167 lb 9.6 oz (76 kg)  Height: 5' 2 (1.575 m)   Body mass index is 30.65 kg/m.  Wt Readings from Last 3 Encounters:  04/04/24 167 lb 9.6 oz (76 kg)  07/21/23 159 lb (72.1 kg)  03/24/23 164 lb 12.8 oz (74.8 kg)    BP Readings from Last 3 Encounters:  04/04/24 130/84  07/21/23 122/78  03/24/23 (!) 154/97   The 10-year ASCVD risk score (Arnett DK, et al., 2019) is: 2.2%*   Values used to calculate the score:     Age: 63 years     Clincally relevant sex: Female     Is Non-Hispanic African American: No     Diabetic: No     Tobacco smoker: No     Systolic Blood Pressure: 130 mmHg     Is BP treated: No     HDL Cholesterol: 60 mg/dL*     Total Cholesterol: 227 mg/dL*     * - Cholesterol units were assumed for this score calculation  Objective:  Physical Exam Vitals and nursing note reviewed.  Constitutional:      Appearance: Normal appearance.  HENT:     Head: Normocephalic and  atraumatic.  Eyes:     Extraocular Movements: Extraocular movements intact.  Cardiovascular:     Rate and Rhythm: Normal rate and regular rhythm.     Heart sounds: Normal heart sounds.  Pulmonary:     Effort: Pulmonary effort is normal.     Breath sounds: Normal breath sounds.  Musculoskeletal:     Cervical back: Normal range of motion.  Skin:    General: Skin is warm.  Neurological:     General: No focal deficit present.     Mental Status: She is alert.  Psychiatric:  Mood and Affect: Mood normal.        Behavior: Behavior normal.         Assessment And Plan:  Essential hypertension, benign Assessment & Plan: BP Readings from Last 3 Encounters:  04/04/24 130/84  07/21/23 122/78  03/24/23 (!) 154/97   BP readings from [facility] visits reviewed. Elevated blood pressure in clinic suggests hypertension; home readings normal, indicating possible white coat hypertension. Family history and renal protection considered. - Monitor blood pressure at home regularly. - Consider ambulatory blood pressure monitoring. - Determine BP medication after reviewing labs. - Consider ARB therapy given renal insufficiency - However, amlodipine or diuretic considered first line for African-American patients  Orders: -     Basic metabolic panel with GFR  Renal insufficiency Assessment & Plan: Creatinine 1.1, hypertension may contribute to renal insufficiency. Potential proteinuria and hydration impact considered. - Order urine test for proteinuria. - Encourage increased water intake. - Recheck kidney function in October. - Order additional kidney labs including SPEP, PTH, and phosphorus.  Orders: -     Microalbumin / creatinine urine ratio -     Basic metabolic panel with GFR  Elevated C-reactive protein (CRP) Assessment & Plan: [facility] labs brought in by patient reviewed.  Elevated CRP indicates inflammation. Controlled allergies and occasional hip pain noted. Dietary modifications  suggested. - Incorporate ginger and turmeric into diet. - Monitor CRP levels periodically.   Other orders -     Triamcinolone  Acetonide; APPLY A THIN LAYER TO THE AFFECTED AREA TWICE DAILY  Dispense: 80 g; Refill: 2 -     Airsupra ; Inhale 2 puffs into the lungs every 4 (four) hours as needed.  Dispense: 10.7 g; Refill: 3  General Health Maintenance Colonoscopy planned due to history of polyps. Participating in senior games, hydration advised. - Schedule colonoscopy in October. - Ensure adequate hydration before physical activities.  Return in about 3 months (around 07/05/2024), or bp check.  Patient was given opportunity to ask questions. Patient verbalized understanding of the plan and was able to repeat key elements of the plan. All questions were answered to their satisfaction.   I, [name], MD, have reviewed all documentation for this visit. The documentation on 04/04/24 for the exam, diagnosis, procedures, and orders are all accurate and complete.   IF YOU HAVE BEEN REFERRED TO A SPECIALIST, IT MAY TAKE 1-2 WEEKS TO SCHEDULE/PROCESS THE REFERRAL. IF YOU HAVE NOT HEARD FROM US /SPECIALIST IN TWO WEEKS, PLEASE GIVE US  A CALL AT [phone].   THE PATIENT IS ENCOURAGED TO PRACTICE SOCIAL DISTANCING DUE TO THE COVID-19 PANDEMIC.

## 2024-04-05 LAB — BASIC METABOLIC PANEL WITH GFR
BUN: 7 mg/dL (ref 7–25)
CO2: 23 mmol/L (ref 20–32)
Calcium: 9.1 mg/dL (ref 8.6–10.4)
Chloride: 104 mmol/L (ref 98–110)
Creat: 1.01 mg/dL (ref 0.50–1.03)
Glucose, Bld: 75 mg/dL (ref 65–99)
Potassium: 4.6 mmol/L (ref 3.5–5.3)
Sodium: 138 mmol/L (ref 135–146)
eGFR: 66 mL/min/1.73m2 (ref >=60)

## 2024-04-05 LAB — MICROALBUMIN / CREATININE URINE RATIO
Creatinine, Urine: 170 mg/dL (ref 20–275)
Microalb, Ur: <0.2 mg/dL

## 2024-04-06 ENCOUNTER — Ambulatory Visit: Payer: Self-pay | Admitting: Internal Medicine

## 2024-04-10 DIAGNOSIS — I10 Essential (primary) hypertension: Secondary | ICD-10-CM | POA: Insufficient documentation

## 2024-04-10 DIAGNOSIS — N289 Disorder of kidney and ureter, unspecified: Secondary | ICD-10-CM | POA: Insufficient documentation

## 2024-04-10 DIAGNOSIS — R7982 Elevated C-reactive protein (CRP): Secondary | ICD-10-CM | POA: Insufficient documentation

## 2024-04-10 NOTE — Assessment & Plan Note (Signed)
 Creatinine 1.1, hypertension may contribute to renal insufficiency. Potential proteinuria and hydration impact considered. - Order urine test for proteinuria. - Encourage increased water intake. - Recheck kidney function in October. - Order additional kidney labs including SPEP, PTH, and phosphorus.

## 2024-04-10 NOTE — Assessment & Plan Note (Addendum)
 BP Readings from Last 3 Encounters:  04/04/24 130/84  07/21/23 122/78  03/24/23 (!) 154/97   BP readings from [facility] visits reviewed. Elevated blood pressure in clinic suggests hypertension; home readings normal, indicating possible white coat hypertension. Family history and renal protection considered. - Monitor blood pressure at home regularly. - Consider ambulatory blood pressure monitoring. - Determine BP medication after reviewing labs. - Consider ARB therapy given renal insufficiency - However, amlodipine or diuretic considered first line for African-American patients

## 2024-04-10 NOTE — Assessment & Plan Note (Signed)
 [facility] labs brought in by patient reviewed.  Elevated CRP indicates inflammation. Controlled allergies and occasional hip pain noted. Dietary modifications suggested. - Incorporate ginger and turmeric into diet. - Monitor CRP levels periodically.

## 2024-04-12 ENCOUNTER — Encounter: Payer: Self-pay | Admitting: Internal Medicine

## 2024-05-02 ENCOUNTER — Encounter: Payer: Self-pay | Admitting: Obstetrics & Gynecology

## 2024-05-02 ENCOUNTER — Other Ambulatory Visit (HOSPITAL_COMMUNITY)
Admission: RE | Admit: 2024-05-02 | Discharge: 2024-05-02 | Disposition: A | Discharge status: Home / Self Care | Payer: Self-pay | Source: Ambulatory Visit | Attending: Obstetrics & Gynecology | Admitting: Obstetrics & Gynecology

## 2024-05-02 ENCOUNTER — Ambulatory Visit (INDEPENDENT_AMBULATORY_CARE_PROVIDER_SITE_OTHER): Payer: Self-pay | Admitting: Obstetrics & Gynecology

## 2024-05-02 VITALS — BP 142/90 | HR 65 | Ht 62.0 in | Wt 168.0 lb

## 2024-05-02 DIAGNOSIS — Z3041 Encounter for surveillance of contraceptive pills: Secondary | ICD-10-CM

## 2024-05-02 DIAGNOSIS — Z01419 Encounter for gynecological examination (general) (routine) without abnormal findings: Secondary | ICD-10-CM

## 2024-05-02 DIAGNOSIS — Z113 Encounter for screening for infections with a predominantly sexual mode of transmission: Secondary | ICD-10-CM | POA: Insufficient documentation

## 2024-05-02 NOTE — Progress Notes (Signed)
 GYNECOLOGY ANNUAL PREVENTATIVE CARE ENCOUNTER NOTE  History:     Frances Stewart is a 53 y.o. G0P0000 female here for a routine annual gynecologic exam.  Current complaints: no gynecologic concerns but desires Lo Loestrin  samples today.   Denies abnormal vaginal bleeding, discharge, pelvic pain, problems with intercourse or other gynecologic concerns.    Gynecologic History Patient's last menstrual period was 05/02/2024 (exact date). Contraception: OCP (estrogen/progesterone) Last Pap: 11/15/2020. Result was normal with negative HPV Last Mammogram: 09/24/2023.  Result was normal Last Colonoscopy: 09/15/2016.  Result was benign and polyps removed.  Obstetric History OB History  Gravida Para Term Preterm AB Living  0 0 0 0 0 0  SAB IAB Ectopic Multiple Live Births  0 0 0 0     Past Medical History:  Diagnosis Date   Allergic rhinitis    Asthma    Eczema     Past Surgical History:  Procedure Laterality Date   REFRACTIVE SURGERY  2008    Current Outpatient Medications on File Prior to Visit  Medication Sig Dispense Refill   albuterol  (VENTOLIN  HFA) 108 (90 Base) MCG/ACT inhaler Inhale 1-2 puffs into the lungs every 6 (six) hours as needed for wheezing or shortness of breath. 18 g 1   Albuterol -Budesonide (AIRSUPRA ) 90-80 MCG/ACT AERO Inhale 2 puffs into the lungs every 4 (four) hours as needed. 10.7 g 3   azelastine  (ASTELIN ) 0.1 % nasal spray Place 1 spray into both nostrils 2 (two) times daily. Use in each nostril as directed 30 mL 11   Blood Pressure Monitoring (BLOOD PRESSURE MONITOR/L CUFF) MISC 1 kit by Does not apply route daily. 1 each 0   cetirizine (ZYRTEC) 10 MG tablet Take 10 mg by mouth daily.     cholecalciferol (VITAMIN D ) 1000 units tablet Take 1,000 Units by mouth daily.      cromolyn (OPTICROM) 4 % ophthalmic solution prn  3   hydrocortisone  2.5 % cream APPLY ON THE FACE AS DIRECTED 60 g 3   Iron, Ferrous Sulfate, 325 (65 Fe) MG TABS PRN week of cycle per  pcp.     LO LOESTRIN FE  1 MG-10 MCG / 10 MCG tablet Take 1 tablet by mouth daily. Please take only active hormonal pills in a continuous fashion.  Do not take nonhormonal/placebo pills. 112 tablet 4   MAGNESIUM GLYCINATE PO Take 240 capsules by mouth.     Multiple Vitamin (MULTIVITAMIN) tablet Take 1 tablet by mouth daily.     Olopatadine  HCl 0.2 % SOLN Place 1 drop into both eyes daily. 5 mL 3   SM EYE ITCH RELIEF 0.025 % ophthalmic solution   3   triamcinolone  cream (KENALOG ) 0.1 % APPLY A THIN LAYER TO THE AFFECTED AREA TWICE DAILY 80 g 2   No current facility-administered medications on file prior to visit.    Allergies  Allergen Reactions   Lanolin Rash   Neomycin Rash    Social History:  reports that she has never smoked. She has never used smokeless tobacco. She reports current alcohol use of about 1.0 standard drink of alcohol per week. She reports that she does not use drugs.  Family History  Problem Relation Age of Onset   Hypertension Mother    Hypertension Father    Hyperlipidemia Father    Heart disease Father    Sarcoidosis Sister    Hypertension Sister    Breast cancer Neg Hx     The following portions of the patient's history  were reviewed and updated as appropriate: allergies, current medications, past family history, past medical history, past social history, past surgical history and problem list.  Review of Systems Pertinent items noted in HPI and remainder of comprehensive ROS otherwise negative.  Physical Exam:  BP (!) 142/90   Pulse 65   Ht 5' 2 (1.575 m)   Wt 168 lb (76.2 kg)   LMP 05/02/2024 (Exact Date)   BMI 30.73 kg/m  Vitals:   05/02/24 1316  BP: (!) 142/90   CONSTITUTIONAL: Well-developed, well-nourished female in no acute distress.  HENT:  Normocephalic, atraumatic, External right and left ear normal.  EYES: Conjunctivae and EOM are normal. Pupils are equal, round, and reactive to light. No scleral icterus.  NECK: Normal range of  motion, supple, no masses.  Normal thyroid.  SKIN: Skin is warm and dry. No rash noted. Not diaphoretic. No erythema. No pallor. MUSCULOSKELETAL: Normal range of motion. No tenderness.  No cyanosis, clubbing, or edema. NEUROLOGIC: Alert and oriented to person, place, and time. Normal reflexes, muscle tone coordination.  PSYCHIATRIC: Normal mood and affect. Normal behavior. Normal judgment and thought content. CARDIOVASCULAR: Normal heart rate noted, regular rhythm RESPIRATORY:  Effort and breath sounds normal, no problems with respiration noted. BREASTS: Symmetric in size. No masses, tenderness, skin changes, nipple drainage, or lymphadenopathy bilaterally. Performed in the presence of a chaperone. ABDOMEN: Soft, no distention noted.  No tenderness, rebound or guarding.  PELVIC: Deferred   Assessment and Plan:     1. Routine screening for STI (sexually transmitted infection) STI screen done, will follow up results and manage accordingly. - RPR+HBsAg+HCVAb+HIV - Cervicovaginal ancillary only( [place])  2. Oral contraceptive pill surveillance Discussed concerns about risks of estrogen and worsening HTN, heart disease, VTE, stroke etc. Lo Loestrin  samples given  as desired.  Recommended FSH test today, to test for menopausal status, patient will consider this - LO LOESTRIN FE  1 MG-10 MCG / 10 MCG tablet; Take 1 tablet by mouth daily.  Dispense: 3 tablet; Refill: 13  3. Women's annual routine gynecological examination Up to date on pap smear and mammogram. Patient reports being scheduled for next colonoscopy on 07/29/2024. Routine preventative health maintenance measures emphasized. Please refer to After Visit Summary for other counseling recommendations.      GLORIS HUGGER, MD, FACOG Obstetrician & Gynecologist, [facility] for [employer], [facility]

## 2024-05-02 NOTE — Progress Notes (Signed)
 Patient presents for Annual.  *Will need to repeat B/P. 147/92/73  LMP: 05/02/2024 Last pap: 11/15/2020 WNL  Contraception: OCP Mammogram: Up to date: 09/24/2023 WNL No Family Hx of Breast Cancer  STD Screening: unsure Self Pay   CC: Cycle is 6-7 days early. Cycles have not been normal every 28 days  Medication Samples have been provided to the patient.  Drug name: LoLoestrin        Strength: 1/10mcg and 10mcg        Qty: 12 boxes  LOT: [id number]  Exp.Date: 02/2025  Dosing instructions: As Directed  The patient has been instructed regarding the correct time, dose, and frequency of taking this medication, including desired effects and most common side effects.   Frances Stewart 1:19 PM 05/02/2024

## 2024-05-03 ENCOUNTER — Ambulatory Visit: Payer: Self-pay | Admitting: Obstetrics & Gynecology

## 2024-05-03 LAB — CERVICOVAGINAL ANCILLARY ONLY
Chlamydia: NEGATIVE
Comment: NEGATIVE
Comment: NEGATIVE
Comment: NORMAL
Neisseria Gonorrhea: NEGATIVE
Trichomonas: NEGATIVE

## 2024-05-19 ENCOUNTER — Encounter (INDEPENDENT_AMBULATORY_CARE_PROVIDER_SITE_OTHER): Payer: Self-pay

## 2024-07-06 ENCOUNTER — Other Ambulatory Visit: Payer: Self-pay

## 2024-07-06 MED ORDER — AIRSUPRA 90-80 MCG/ACT IN AERO: 2.0000 | INHALATION_SPRAY | RESPIRATORY_TRACT | 3 refills | Status: AC | PRN

## 2024-07-28 ENCOUNTER — Encounter: Payer: Self-pay | Admitting: Dermatology

## 2024-07-28 ENCOUNTER — Ambulatory Visit (INDEPENDENT_AMBULATORY_CARE_PROVIDER_SITE_OTHER): Payer: Self-pay | Admitting: Dermatology

## 2024-07-28 ENCOUNTER — Other Ambulatory Visit: Payer: Self-pay | Admitting: Internal Medicine

## 2024-07-28 VITALS — BP 136/96

## 2024-07-28 DIAGNOSIS — Z1231 Encounter for screening mammogram for malignant neoplasm of breast: Secondary | ICD-10-CM

## 2024-07-28 DIAGNOSIS — K5900 Constipation, unspecified: Secondary | ICD-10-CM | POA: Insufficient documentation

## 2024-07-28 DIAGNOSIS — K5904 Chronic idiopathic constipation: Secondary | ICD-10-CM | POA: Insufficient documentation

## 2024-07-28 DIAGNOSIS — D509 Iron deficiency anemia, unspecified: Secondary | ICD-10-CM | POA: Insufficient documentation

## 2024-07-28 DIAGNOSIS — L209 Atopic dermatitis, unspecified: Secondary | ICD-10-CM

## 2024-07-28 DIAGNOSIS — D3610 Benign neoplasm of peripheral nerves and autonomic nervous system, unspecified: Secondary | ICD-10-CM

## 2024-07-28 DIAGNOSIS — L811 Chloasma: Secondary | ICD-10-CM

## 2024-07-28 DIAGNOSIS — D234 Other benign neoplasm of skin of scalp and neck: Secondary | ICD-10-CM

## 2024-07-28 DIAGNOSIS — L309 Dermatitis, unspecified: Secondary | ICD-10-CM

## 2024-07-28 DIAGNOSIS — R194 Change in bowel habit: Secondary | ICD-10-CM | POA: Insufficient documentation

## 2024-07-28 DIAGNOSIS — Z860100 Personal history of colon polyps, unspecified: Secondary | ICD-10-CM | POA: Insufficient documentation

## 2024-07-28 MED ORDER — PIMECROLIMUS 1 % EX CREA: TOPICAL_CREAM | Freq: Two times a day (BID) | CUTANEOUS | 2 refills | Status: AC

## 2024-07-28 NOTE — Patient Instructions (Signed)

## 2024-07-28 NOTE — Progress Notes (Signed)
   New Patient Visit   Subjective  Frances Stewart is a 53 y.o. female who presents for a NEW PATIENT appointment to be examined for the concerns as listed below.   Lipoma:  located at the L shoulder/neck that presented more that 7 years ago. Are is not painful but gets caught on jewelry and shirt collars. She has been seen by PCP in regards and requested referral to dermatology to discuss removal.    Are you nursing, pregnant or trying to conceive? No   No Hx of Bx. No family Hx of skin cancer.   The following portions of the chart were reviewed this encounter and updated as appropriate: medications, allergies, medical history  Review of Systems:  No other skin or systemic complaints except as noted in HPI or Assessment and Plan.  Objective  Well appearing patient in no apparent distress; mood and affect are within normal limits.   A focused examination was performed of the following areas: L shoulder/neck   Relevant exam findings are noted in the Assessment and Plan.          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 - Refer to Dr. Pasi for surgical excision to avoid a large hole from a shave removal and ensure a cosmetically favorable outcome. - Referral to a plastic surgeon for a more aesthetically pleasing linear scar. - Document size and location of neurofibroma.  Atopic dermatitis Chronic condition since infancy, currently well-managed with occasional flares. - Prescribe generic pimecrolimus  (Elidel ) for use during flares.  Melasma Melasma with increased prominence due to sun exposure, particularly in [place] . Diligent with sunscreen use but experiences recurrence with sun exposure. - Provide sample of Radiant Tone with thiaminol for trial. - Advise consistent use of sunscreen and reapplication every four hours.   Return for scheduled Ex with KP  for benign grown - neurofibroma  & PRN with JD.   Documentation: I have reviewed the above documentation for accuracy and completeness, and I agree with the above.  I, Shirron Maranda, CMA, am acting as scribe for [employer], DO.   Delon Lenis, DO

## 2024-07-29 LAB — HM COLONOSCOPY

## 2024-08-01 ENCOUNTER — Ambulatory Visit: Payer: Self-pay | Admitting: Internal Medicine

## 2024-08-02 LAB — COMPREHENSIVE METABOLIC PANEL WITH GFR
Albumin: 4.6 (ref 3.5–5.0)
Calcium: 9.2 (ref 8.7–10.7)
Globulin: 2.5
eGFR: 57

## 2024-08-02 LAB — HEPATIC FUNCTION PANEL
ALT: 10 U/L (ref 7–35)
AST: 21 (ref 13–35)
Alkaline Phosphatase: 62 (ref 25–125)
Bilirubin, Direct: 0.1 (ref 0.01–0.4)
Bilirubin, Total: 0.4

## 2024-08-02 LAB — CBC AND DIFFERENTIAL
HCT: 40 (ref 36–46)
Hemoglobin: 13.6 (ref 12.0–16.0)
Platelets: 304 K/uL (ref 150–400)
WBC: 4

## 2024-08-02 LAB — LIPID PANEL
Cholesterol: 244 — AB (ref 0–200)
HDL: 58 (ref 35–70)
LDL Cholesterol: 169
LDl/HDL Ratio: 4.2
Triglycerides: 73 (ref 40–160)

## 2024-08-02 LAB — CBC: RBC: 4.25 (ref 3.87–5.11)

## 2024-08-02 LAB — IRON,TIBC AND FERRITIN PANEL: Ferritin: 34

## 2024-08-02 LAB — BASIC METABOLIC PANEL WITH GFR
Creatinine: 1.1 (ref 0.5–1.1)
Glucose: 84

## 2024-08-02 LAB — TSH: TSH: 1.22 (ref 0.41–5.90)

## 2024-08-02 LAB — VITAMIN D 25 HYDROXY (VIT D DEFICIENCY, FRACTURES): Vit D, 25-Hydroxy: 24

## 2024-08-02 LAB — HEMOGLOBIN A1C
Hemoglobin A1C: 5.2
Hemoglobin A1C: 5.2

## 2024-08-29 ENCOUNTER — Ambulatory Visit (INDEPENDENT_AMBULATORY_CARE_PROVIDER_SITE_OTHER): Payer: Self-pay | Admitting: Internal Medicine

## 2024-08-29 VITALS — BP 122/80 | HR 77 | Temp 98.3°F | Ht 62.0 in | Wt 174.6 lb

## 2024-08-29 DIAGNOSIS — R7982 Elevated C-reactive protein (CRP): Secondary | ICD-10-CM

## 2024-08-29 DIAGNOSIS — E7841 Elevated Lipoprotein(a): Secondary | ICD-10-CM

## 2024-08-29 DIAGNOSIS — N289 Disorder of kidney and ureter, unspecified: Secondary | ICD-10-CM

## 2024-08-29 DIAGNOSIS — R030 Elevated blood-pressure reading, without diagnosis of hypertension: Secondary | ICD-10-CM

## 2024-08-29 DIAGNOSIS — K6389 Other specified diseases of intestine: Secondary | ICD-10-CM

## 2024-08-29 DIAGNOSIS — I10 Essential (primary) hypertension: Secondary | ICD-10-CM

## 2024-08-29 NOTE — Progress Notes (Signed)
 I,Victoria T Stewart, CMA,acting as a neurosurgeon for Frances LOISE Slocumb, MD.,have documented all relevant documentation on the behalf of Frances LOISE Slocumb, MD,as directed by  Frances LOISE Slocumb, MD while in the presence of Frances LOISE Slocumb, MD.  Subjective:  Patient ID: Frances Stewart , female    DOB: Feb 25, 1971 , 53 y.o.   MRN: [id number]  Chief Complaint  Patient presents with   Hypertension    Patient presents today for bpc. She reports compliance with medications. Denies headache, chest pain & sob. She wants to know if she is to start on low dose cholesterol medication. Due to high cholesterol test result.     HPI Discussed the use of AI scribe software for clinical note transcription with the patient, who gave verbal consent to proceed.  History of Present Illness Frances Stewart is a 53 year old female who presents for evaluation of her cardiovascular risk and follow-up on recent colonoscopy findings.  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 Her LDL cholesterol is 169, and she has been managing her cholesterol levels with Metamucil and swimming, which has helped reduce her cholesterol by almost 40 points since March. Her triglycerides are within normal limits.  Her blood pressure was high at 134/94 on October 4th and was 122/80 at a more recent visit. She reports increased water intake. Her GFR remains below 60, and she acknowledges previous poor hydration habits. She has undergone a urine study focusing on albumin, which is more related to blood pressure than cholesterol.  She has a history of colon polyps and recently underwent a colonoscopy, which revealed three polyps and an inflammatory inclusion that may be post-infectious. She has a history of elevated CRP levels without a clear cause. No diarrhea, but she has chronic constipation. She is concerned about the potential link between her condition and sarcoidosis, as her family history includes sarcoidosis in the lungs.  Her vitamin D   levels are low. She has started taking vitamin C, which could cause diarrhea. She is actively seeking more information and guidance on her condition, including potential lifestyle and dietary changes.   Past Medical History:  Diagnosis Date   Allergic rhinitis    Asthma    Eczema      Family History  Problem Relation Age of Onset   Hypertension Mother    Hypertension Father    Hyperlipidemia Father    Heart disease Father    Sarcoidosis Sister    Hypertension Sister    Breast cancer Neg Hx      Current Outpatient Medications:    albuterol  (VENTOLIN  HFA) 108 (90 Base) MCG/ACT inhaler, Inhale 1-2 puffs into the lungs every 6 (six) hours as needed for wheezing or shortness of breath., Disp: 18 g, Rfl: 1   Albuterol -Budesonide (AIRSUPRA ) 90-80 MCG/ACT AERO, Inhale 2 puffs into the lungs every 4 (four) hours as needed., Disp: 10.7 g, Rfl: 3   azelastine  (ASTELIN ) 0.1 % nasal spray, Place 1 spray into both nostrils 2 (two) times daily. Use in each nostril as directed, Disp: 30 mL, Rfl: 11   Blood Pressure Monitoring (BLOOD PRESSURE MONITOR/L CUFF) MISC, 1 kit by Does not apply route daily., Disp: 1 each, Rfl: 0   cetirizine (ZYRTEC) 10 MG tablet, Take 10 mg by mouth daily., Disp: , Rfl:    hydrocortisone  2.5 % cream, APPLY ON THE FACE AS DIRECTED, Disp: 60 g, Rfl: 3   Iron, Ferrous Sulfate, 325 (65 Fe) MG TABS, PRN week of cycle per  pcp., Disp: , Rfl:    LO LOESTRIN FE  1 MG-10 MCG / 10 MCG tablet, Take 1 tablet by mouth daily. Please take only active hormonal pills in a continuous fashion.  Do not take nonhormonal/placebo pills., Disp: 112 tablet, Rfl: 4   MAGNESIUM GLYCINATE PO, Take 240 capsules by mouth., Disp: , Rfl:    Multiple Vitamin (MULTIVITAMIN) tablet, Take 1 tablet by mouth daily., Disp: , Rfl:    Olopatadine  HCl 0.2 % SOLN, Place 1 drop into both eyes daily., Disp: 5 mL, Rfl: 3   pimecrolimus  (ELIDEL ) 1 % cream, Apply topically 2 (two) times daily. Use PRN for flares., Disp:  100 g, Rfl: 2   SM EYE ITCH RELIEF 0.025 % ophthalmic solution, , Disp: , Rfl: 3   triamcinolone  cream (KENALOG ) 0.1 %, APPLY A THIN LAYER TO THE AFFECTED AREA TWICE DAILY, Disp: 80 g, Rfl: 2   Allergies  Allergen Reactions   Lanolin Rash   Neomycin Rash     Review of Systems  Constitutional: Negative.   Respiratory: Negative.    Cardiovascular: Negative.   Gastrointestinal: Negative.   Neurological: Negative.   Psychiatric/Behavioral: Negative.       Today's Vitals   08/29/24 1517  BP: 122/80  Pulse: 77  Temp: 98.3 F (36.8 C)  SpO2: 98%  Weight: 174 lb 9.6 oz (79.2 kg)  Height: 5' 2 (1.575 m)   Body mass index is 31.93 kg/m.  Wt Readings from Last 3 Encounters:  08/29/24 174 lb 9.6 oz (79.2 kg)  05/02/24 168 lb (76.2 kg)  04/04/24 167 lb 9.6 oz (76 kg)     Objective:  Physical Exam Vitals and nursing note reviewed.  Constitutional:      Appearance: Normal appearance.  HENT:     Head: Normocephalic and atraumatic.  Eyes:     Extraocular Movements: Extraocular movements intact.  Cardiovascular:     Rate and Rhythm: Normal rate and regular rhythm.     Heart sounds: Normal heart sounds.  Pulmonary:     Effort: Pulmonary effort is normal.     Breath sounds: Normal breath sounds.  Musculoskeletal:     Cervical back: Normal range of motion.  Skin:    General: Skin is warm.  Neurological:     General: No focal deficit present.     Mental Status: She is alert.  Psychiatric:        Mood and Affect: Mood normal.        Behavior: Behavior normal.         Assessment And Plan:  Elevated blood pressure reading Assessment & Plan: Her readings have improved since June 2024. Encouraged to follow DASH diet - free of processed meats and foods. - She will continue to monitor her blood pressure and will alert me if she needs further evaluation.  - Lastly, recent labs drawn during biometric screening were reviewed in detail.  - We will need to start meds if her BP  readings are consistently over 130/80.    Renal insufficiency Assessment & Plan: We discussed possibility of her having stage 3 CKD. Blood pressure improved with increased water intake. Monitoring microalbumin for potential antihypertensive therapy to protect kidneys. - Monitor microalbumin levels. - Increase water intake to maintain blood pressure control.   Malakoplakia of colon Assessment & Plan: Malakoplakia identified during colonoscopy, rare granulomatous condition. Differential includes sarcoidosis. Chronic constipation present. Discussed potential link between malakoplakia and sarcoidosis. Considered long-term antibiotics and vitamin supplementation. - Consider ANA and ACE level  testing for autoimmune issues and sarcoidosis. - She prefers to see specialist to discuss next steps of workup.  - Discuss with GI specialist regarding surveillance and management. - Consider referral to immunologist for further evaluation. - Consider vitamin C supplementation to enhance macrophage function.   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 LDL at 169. No specific treatment for lipoprotein(a). Consider cardiac calcium score for coronary calcification. Statin therapy if calcium score indicates risk. - Consider cardiac calcium score for coronary artery calcification. - Continue lifestyle modifications including diet and exercise. - Consider statin therapy if calcium score indicates risk.   Elevated C-reactive protein (CRP) Assessment & Plan: [facility] labs brought in by patient reviewed.  Elevated CRP indicates inflammation. Controlled allergies and occasional hip pain noted. Dietary modifications suggested. - Incorporate ginger and turmeric into diet. - Monitor CRP levels periodically. - Consider autoimmune workup. - Elevated levels ossibly related to malakoplakia????     Return for 6 month bpc. .  Patient was given  opportunity to ask questions. Patient verbalized understanding of the plan and was able to repeat key elements of the plan. All questions were answered to their satisfaction.   I, Frances LOISE Slocumb, MD, have reviewed all documentation for this visit. The documentation on 08/29/24 for the exam, diagnosis, procedures, and orders are all accurate and complete.  IF YOU HAVE BEEN REFERRED TO A SPECIALIST, IT MAY TAKE 1-2 WEEKS TO SCHEDULE/PROCESS THE REFERRAL. IF YOU HAVE NOT HEARD FROM US /SPECIALIST IN TWO WEEKS, PLEASE GIVE US  A CALL AT [phone].   THE PATIENT IS ENCOURAGED TO PRACTICE SOCIAL DISTANCING DUE TO THE COVID-19 PANDEMIC.

## 2024-08-29 NOTE — Patient Instructions (Signed)
 Hypertension, Adult Hypertension is another name for high blood pressure. High blood pressure forces your heart to work harder to pump blood. This can cause problems over time. There are two numbers in a blood pressure reading. There is a top number (systolic) over a bottom number (diastolic). It is best to have a blood pressure that is below 120/80. What are the causes? The cause of this condition is not known. Some other conditions can lead to high blood pressure. What increases the risk? Some lifestyle factors can make you more likely to develop high blood pressure: Smoking. Not getting enough exercise or physical activity. Being overweight. Having too much fat, sugar, calories, or salt (sodium) in your diet. Drinking too much alcohol. Other risk factors include: Having any of these conditions: Heart disease. Diabetes. High cholesterol. Kidney disease. Obstructive sleep apnea. Having a family history of high blood pressure and high cholesterol. Age. The risk increases with age. Stress. What are the signs or symptoms? High blood pressure may not cause symptoms. Very high blood pressure (hypertensive crisis) may cause: Headache. Fast or uneven heartbeats (palpitations). Shortness of breath. Nosebleed. Vomiting or feeling like you may vomit (nauseous). Changes in how you see. Very bad chest pain. Feeling dizzy. Seizures. How is this treated? This condition is treated by making healthy lifestyle changes, such as: Eating healthy foods. Exercising more. Drinking less alcohol. Your doctor may prescribe medicine if lifestyle changes do not help enough and if: Your top number is above 130. Your bottom number is above 80. Your personal target blood pressure may vary. Follow these instructions at home: Eating and drinking  If told, follow the DASH eating plan. To follow this plan: Fill one half of your plate at each meal with fruits and vegetables. Fill one fourth of your plate  at each meal with whole grains. Whole grains include whole-wheat pasta, brown rice, and whole-grain bread. Eat or drink low-fat dairy products, such as skim milk or low-fat yogurt. Fill one fourth of your plate at each meal with low-fat (lean) proteins. Low-fat proteins include fish, chicken without skin, eggs, beans, and tofu. Avoid fatty meat, cured and processed meat, or chicken with skin. Avoid pre-made or processed food. Limit the amount of salt in your diet to less than 1,500 mg each day. Do not drink alcohol if: Your doctor tells you not to drink. You are pregnant, may be pregnant, or are planning to become pregnant. If you drink alcohol: Limit how much you have to: 0-1 drink a day for women. 0-2 drinks a day for men. Know how much alcohol is in your drink. In the [place] one drink equals one 12 oz bottle of beer (355 mL), one 5 oz glass of wine (148 mL), or one 1 oz glass of hard liquor (44 mL). Lifestyle  Work with your doctor to stay at a healthy weight or to lose weight. Ask your doctor what the best weight is for you. Get at least 30 minutes of exercise that causes your heart to beat faster (aerobic exercise) most days of the week. This may include walking, swimming, or biking. Get at least 30 minutes of exercise that strengthens your muscles (resistance exercise) at least 3 days a week. This may include lifting weights or doing Pilates. Do not smoke or use any products that contain nicotine or tobacco. If you need help quitting, ask your doctor. Check your blood pressure at home as told by your doctor. Keep all follow-up visits. Medicines Take over-the-counter and prescription medicines  only as told by your doctor. Follow directions carefully. Do not skip doses of blood pressure medicine. The medicine does not work as well if you skip doses. Skipping doses also puts you at risk for problems. Ask your doctor about side effects or reactions to medicines that you should watch  for. Contact a doctor if: You think you are having a reaction to the medicine you are taking. You have headaches that keep coming back. You feel dizzy. You have swelling in your ankles. You have trouble with your vision. Get help right away if: You get a very bad headache. You start to feel mixed up (confused). You feel weak or numb. You feel faint. You have very bad pain in your: Chest. Belly (abdomen). You vomit more than once. You have trouble breathing. These symptoms may be an emergency. Get help right away. Call 911. Do not wait to see if the symptoms will go away. Do not drive yourself to the hospital. Summary Hypertension is another name for high blood pressure. High blood pressure forces your heart to work harder to pump blood. For most people, a normal blood pressure is less than 120/80. Making healthy choices can help lower blood pressure. If your blood pressure does not get lower with healthy choices, you may need to take medicine. This information is not intended to replace advice given to you by your health care provider. Make sure you discuss any questions you have with your health care provider. Document Revised: 07/04/2021 Document Reviewed: 07/04/2021 Elsevier Patient Education  2024 [employer].

## 2024-09-04 DIAGNOSIS — E7841 Elevated Lipoprotein(a): Secondary | ICD-10-CM | POA: Insufficient documentation

## 2024-09-04 DIAGNOSIS — K6389 Other specified diseases of intestine: Secondary | ICD-10-CM | POA: Insufficient documentation

## 2024-09-04 NOTE — Assessment & Plan Note (Signed)
 [facility] labs brought in by patient reviewed.  Elevated CRP indicates inflammation. Controlled allergies and occasional hip pain noted. Dietary modifications suggested. - Incorporate ginger and turmeric into diet. - Monitor CRP levels periodically. - Consider autoimmune workup. - Elevated levels ossibly related to malakoplakia????

## 2024-09-04 NOTE — Assessment & Plan Note (Signed)
 Malakoplakia identified during colonoscopy, rare granulomatous condition. Differential includes sarcoidosis. Chronic constipation present. Discussed potential link between malakoplakia and sarcoidosis. Considered long-term antibiotics and vitamin supplementation. - Consider ANA and ACE level testing for autoimmune issues and sarcoidosis. - She prefers to see specialist to discuss next steps of workup.  - Discuss with GI specialist regarding surveillance and management. - Consider referral to immunologist for further evaluation. - Consider vitamin C supplementation to enhance macrophage function.

## 2024-09-04 NOTE — Assessment & Plan Note (Signed)
 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 LDL at 169. No specific treatment for lipoprotein(a). Consider cardiac calcium score for coronary calcification. Statin therapy if calcium score indicates risk. - Consider cardiac calcium score for coronary artery calcification. - Continue lifestyle modifications including diet and exercise. - Consider statin therapy if calcium score indicates risk.

## 2024-09-04 NOTE — Assessment & Plan Note (Signed)
 We discussed possibility of her having stage 3 CKD. Blood pressure improved with increased water intake. Monitoring microalbumin for potential antihypertensive therapy to protect kidneys. - Monitor microalbumin levels. - Increase water intake to maintain blood pressure control.

## 2024-09-04 NOTE — Assessment & Plan Note (Signed)
 Her readings have improved since June 2024. Encouraged to follow DASH diet - free of processed meats and foods. - She will continue to monitor her blood pressure and will alert me if she needs further evaluation.  - Lastly, recent labs drawn during biometric screening were reviewed in detail.  - We will need to start meds if her BP readings are consistently over 130/80.

## 2024-09-23 ENCOUNTER — Ambulatory Visit
Admission: RE | Admit: 2024-09-23 | Discharge: 2024-09-23 | Disposition: A | Discharge status: Home / Self Care | Payer: Self-pay | Source: Ambulatory Visit | Attending: Internal Medicine | Admitting: Internal Medicine

## 2024-09-23 DIAGNOSIS — Z1231 Encounter for screening mammogram for malignant neoplasm of breast: Secondary | ICD-10-CM

## 2025-03-06 ENCOUNTER — Ambulatory Visit: Payer: Self-pay | Admitting: Internal Medicine
